# Patient Record
Sex: Female | Born: 1990
Health system: Southern US, Community
[De-identification: ages and names within clinical notes are randomized; demographics above are authoritative.]

## PROBLEM LIST (undated history)

## (undated) DIAGNOSIS — F329 Major depressive disorder, single episode, unspecified: Secondary | ICD-10-CM

## (undated) DIAGNOSIS — F32A Depression, unspecified: Secondary | ICD-10-CM

## (undated) DIAGNOSIS — F419 Anxiety disorder, unspecified: Secondary | ICD-10-CM

## (undated) DIAGNOSIS — N390 Urinary tract infection, site not specified: Secondary | ICD-10-CM

## (undated) DIAGNOSIS — G43909 Migraine, unspecified, not intractable, without status migrainosus: Secondary | ICD-10-CM

## (undated) DIAGNOSIS — A749 Chlamydial infection, unspecified: Secondary | ICD-10-CM

## (undated) HISTORY — DX: Migraine, unspecified, not intractable, without status migrainosus: G43.909

## (undated) HISTORY — DX: Depression, unspecified: F32.A

## (undated) HISTORY — PX: WISDOM TOOTH EXTRACTION: SHX21

## (undated) HISTORY — DX: Anxiety disorder, unspecified: F41.9

## (undated) HISTORY — DX: Chlamydial infection, unspecified: A74.9

## (undated) HISTORY — DX: Urinary tract infection, site not specified: N39.0

## (undated) HISTORY — PX: TONSILLECTOMY: SUR1361

## (undated) HISTORY — DX: Major depressive disorder, single episode, unspecified: F32.9

## (undated) HISTORY — PX: ADENOIDECTOMY: SUR15

---

## 2012-02-23 ENCOUNTER — Observation Stay: Payer: Self-pay | Admitting: Obstetrics and Gynecology

## 2012-02-23 LAB — PIH PROFILE
Anion Gap: 9 (ref 7–16)
Calcium, Total: 8.7 mg/dL (ref 8.5–10.1)
Co2: 25 mmol/L (ref 21–32)
Creatinine: 0.7 mg/dL (ref 0.60–1.30)
EGFR (African American): >60
EGFR (Non-African Amer.): >60
Glucose: 81 mg/dL (ref 65–99)
HCT: 33.4 % — ABNORMAL LOW (ref 35.0–47.0)
HGB: 11.4 g/dL — ABNORMAL LOW (ref 12.0–16.0)
MCHC: 34 g/dL (ref 32.0–36.0)
Osmolality: 272 (ref 275–301)
Platelet: 306 10*3/uL (ref 150–440)
RBC: 3.7 10*6/uL — ABNORMAL LOW (ref 3.80–5.20)
SGOT(AST): 11 U/L — ABNORMAL LOW (ref 15–37)
Sodium: 138 mmol/L (ref 136–145)
Uric Acid: 4.5 mg/dL (ref 2.6–6.0)
WBC: 15.1 10*3/uL — ABNORMAL HIGH (ref 3.6–11.0)

## 2012-02-23 LAB — PROTEIN / CREATININE RATIO, URINE: Protein/Creat. Ratio: 153 mg/gCREAT (ref 0–200)

## 2012-02-29 ENCOUNTER — Inpatient Hospital Stay: Payer: Self-pay | Admitting: Obstetrics & Gynecology

## 2012-02-29 LAB — CBC WITH DIFFERENTIAL/PLATELET
Eosinophil #: 0.1 10*3/uL (ref 0.0–0.7)
HCT: 35.9 % (ref 35.0–47.0)
HGB: 12.3 g/dL (ref 12.0–16.0)
Lymphocyte #: 1.9 10*3/uL (ref 1.0–3.6)
MCH: 30.7 pg (ref 26.0–34.0)
MCHC: 34.2 g/dL (ref 32.0–36.0)
Monocyte #: 0.7 x10 3/mm (ref 0.2–0.9)
Neutrophil %: 82.1 %
Platelet: 340 10*3/uL (ref 150–440)
WBC: 15.6 10*3/uL — ABNORMAL HIGH (ref 3.6–11.0)

## 2015-08-16 LAB — HM PAP SMEAR

## 2016-10-19 ENCOUNTER — Emergency Department
Admission: EM | Admit: 2016-10-19 | Discharge: 2016-10-19 | Disposition: A | Discharge status: Home / Self Care | Payer: Self-pay | Attending: Emergency Medicine | Admitting: Emergency Medicine

## 2016-10-19 ENCOUNTER — Encounter: Payer: Self-pay | Admitting: Emergency Medicine

## 2016-10-19 ENCOUNTER — Emergency Department: Payer: Self-pay

## 2016-10-19 DIAGNOSIS — N83291 Other ovarian cyst, right side: Secondary | ICD-10-CM | POA: Insufficient documentation

## 2016-10-19 DIAGNOSIS — R102 Pelvic and perineal pain: Secondary | ICD-10-CM | POA: Insufficient documentation

## 2016-10-19 DIAGNOSIS — N342 Other urethritis: Secondary | ICD-10-CM

## 2016-10-19 DIAGNOSIS — N341 Nonspecific urethritis: Secondary | ICD-10-CM | POA: Insufficient documentation

## 2016-10-19 DIAGNOSIS — N83201 Unspecified ovarian cyst, right side: Secondary | ICD-10-CM

## 2016-10-19 DIAGNOSIS — A749 Chlamydial infection, unspecified: Secondary | ICD-10-CM | POA: Insufficient documentation

## 2016-10-19 DIAGNOSIS — F17200 Nicotine dependence, unspecified, uncomplicated: Secondary | ICD-10-CM | POA: Insufficient documentation

## 2016-10-19 LAB — URINALYSIS, COMPLETE (UACMP) WITH MICROSCOPIC
BILIRUBIN URINE: NEGATIVE
Bacteria, UA: NONE SEEN
GLUCOSE, UA: NEGATIVE mg/dL
Ketones, ur: NEGATIVE mg/dL
NITRITE: NEGATIVE
PROTEIN: 30 mg/dL — AB
Specific Gravity, Urine: 1.012 (ref 1.005–1.030)
pH: 6 (ref 5.0–8.0)

## 2016-10-19 LAB — COMPREHENSIVE METABOLIC PANEL
ALBUMIN: 4.2 g/dL (ref 3.5–5.0)
ALT: 18 U/L (ref 14–54)
ANION GAP: 8 (ref 5–15)
AST: 22 U/L (ref 15–41)
Alkaline Phosphatase: 58 U/L (ref 38–126)
BILIRUBIN TOTAL: 0.6 mg/dL (ref 0.3–1.2)
BUN: 11 mg/dL (ref 6–20)
CHLORIDE: 108 mmol/L (ref 101–111)
CO2: 24 mmol/L (ref 22–32)
Calcium: 9.1 mg/dL (ref 8.9–10.3)
Creatinine, Ser: 0.85 mg/dL (ref 0.44–1.00)
GFR calc Af Amer: >60 mL/min (ref >60)
GFR calc non Af Amer: >60 mL/min (ref >60)
GLUCOSE: 94 mg/dL (ref 65–99)
POTASSIUM: 4 mmol/L (ref 3.5–5.1)
Sodium: 140 mmol/L (ref 135–145)
TOTAL PROTEIN: 7.5 g/dL (ref 6.5–8.1)

## 2016-10-19 LAB — CBC
HCT: 41.9 % (ref 35.0–47.0)
HEMOGLOBIN: 14.2 g/dL (ref 12.0–16.0)
MCH: 30.4 pg (ref 26.0–34.0)
MCHC: 33.9 g/dL (ref 32.0–36.0)
MCV: 89.6 fL (ref 80.0–100.0)
PLATELETS: 406 10*3/uL (ref 150–440)
RBC: 4.68 MIL/uL (ref 3.80–5.20)
RDW: 13.2 % (ref 11.5–14.5)
WBC: 9.2 10*3/uL (ref 3.6–11.0)

## 2016-10-19 LAB — CHLAMYDIA/NGC RT PCR (ARMC ONLY)
CHLAMYDIA TR: DETECTED — AB
N GONORRHOEAE: NOT DETECTED

## 2016-10-19 LAB — WET PREP, GENITAL
Clue Cells Wet Prep HPF POC: NONE SEEN
Sperm: NONE SEEN
Trich, Wet Prep: NONE SEEN
YEAST WET PREP: NONE SEEN

## 2016-10-19 LAB — POCT PREGNANCY, URINE: PREG TEST UR: NEGATIVE

## 2016-10-19 LAB — LIPASE, BLOOD: Lipase: 23 U/L (ref 11–51)

## 2016-10-19 MED ORDER — HYDROCODONE-ACETAMINOPHEN 5-325 MG PO TABS: 1.0000 | ORAL_TABLET | Freq: Four times a day (QID) | ORAL | 0 refills | Status: DC | PRN

## 2016-10-19 MED ORDER — MORPHINE SULFATE (PF) 4 MG/ML IV SOLN
4.0000 mg | Freq: Once | INTRAVENOUS | Status: AC
  Administered 2016-10-19: 4 mg via INTRAVENOUS
  Filled 2016-10-19: qty 1

## 2016-10-19 MED ORDER — DEXTROSE 5 % IV SOLN
1.0000 g | Freq: Once | INTRAVENOUS | Status: AC
  Administered 2016-10-19: 1 g via INTRAVENOUS
  Filled 2016-10-19: qty 10

## 2016-10-19 MED ORDER — ONDANSETRON HCL 4 MG/2ML IJ SOLN
4.0000 mg | Freq: Once | INTRAMUSCULAR | Status: AC
  Administered 2016-10-19: 4 mg via INTRAVENOUS
  Filled 2016-10-19: qty 2

## 2016-10-19 MED ORDER — IBUPROFEN 800 MG PO TABS: 800.0000 mg | ORAL_TABLET | Freq: Three times a day (TID) | ORAL | 0 refills | Status: DC | PRN

## 2016-10-19 MED ORDER — AZITHROMYCIN 1 G PO PACK
1.0000 g | PACK | Freq: Once | ORAL | Status: AC
  Administered 2016-10-19: 1 g via ORAL
  Filled 2016-10-19: qty 1

## 2016-10-19 MED ORDER — DOXYCYCLINE HYCLATE 100 MG PO CAPS
100.0000 mg | ORAL_CAPSULE | Freq: Two times a day (BID) | ORAL | 0 refills | Status: DC
Start: 2016-10-19 — End: 2017-05-16

## 2016-10-19 MED ORDER — SODIUM CHLORIDE 0.9 % IV BOLUS (SEPSIS)
1000.0000 mL | Freq: Once | INTRAVENOUS | Status: AC
  Administered 2016-10-19: 1000 mL via INTRAVENOUS

## 2016-10-19 NOTE — Discharge Instructions (Signed)
Take motrin for pain.   Take vicodin for severe pain. Do NOT drive with it.   You have a cyst in your ovary that will likely resolve on its own. See GYN doctor for repeat ultrasound.   You have urinary tract infection and chlamydia. Take doxycyline as prescribed. Your partner needs to be notified   Return to ER if you have worse abdominal pain, fever, vomiting, worse pelvic discharge.

## 2016-10-19 NOTE — ED Provider Notes (Signed)
[facility]-EMERGENCY DEPARTMENT Provider Note   CSN: [id number] Arrival date & time: 10/19/16  1015     History   Chief Complaint Chief Complaint  Patient presents with  . Abdominal Pain    HPI Whitney Rojas is a 26 y.o. female otherwise healthy here with RLQ pain. RLQ pain for the last 2 days. It is worse in the morning and a crampy sensation that lasts for hours. She felt nauseated but had no vomiting or fevers. Has vaginal discharge but no bleeding. LMP was 2 weeks ago. She has no urinary symptoms. Patient had no previous abdominal surgeries.   The history is provided by the patient.    History reviewed. No pertinent past medical history.  There are no active problems to display for this patient.   Past Surgical History:  Procedure Laterality Date  . ADENOIDECTOMY    . TONSILLECTOMY      OB History    No data available       Home Medications    Prior to Admission medications   Not on File    Family History History reviewed. No pertinent family history.  Social History Social History  Substance Use Topics  . Smoking status: Current Every Day Smoker  . Smokeless tobacco: Never Used  . Alcohol use Yes     Allergies   Patient has no known allergies.   Review of Systems Review of Systems  Gastrointestinal: Positive for abdominal pain and nausea.  All other systems reviewed and are negative.    Physical Exam Updated Vital Signs BP 120/89   Pulse 63   Temp 98 F (36.7 C) (Oral)   Resp 13   Ht 5\' 5"  (1.651 m)   Wt 90.7 kg (200 lb)   LMP 10/03/2016   SpO2 95%   BMI 33.28 kg/m   Physical Exam  Constitutional: She is oriented to person, place, and time.  Uncomfortable, tearful   HENT:  Head: Normocephalic.  Mouth/Throat: Oropharynx is clear and moist.  Eyes: Conjunctivae and EOM are normal. Pupils are equal, round, and reactive to light.  Neck: Normal range of motion. Neck supple.  Cardiovascular: Normal rate, regular rhythm and normal  heart sounds.   Pulmonary/Chest: Effort normal and breath sounds normal. No respiratory distress. She has no wheezes.  Abdominal: Soft. Bowel sounds are normal.  + RLQ tenderness, no rebound   Genitourinary:  Genitourinary Comments: Mild R adnexal tenderness, + pelvic discharge. No CMT. Mild uterine tenderness   Musculoskeletal: Normal range of motion.  Neurological: She is alert and oriented to person, place, and time.  Skin: Skin is warm.  Psychiatric: She has a normal mood and affect.  Nursing note and vitals reviewed.    ED Treatments / Results  Labs (all labs ordered are listed, but only abnormal results are displayed) Labs Reviewed  WET PREP, GENITAL - Abnormal; Notable for the following:       Result Value   WBC, Wet Prep HPF POC MANY (*)    All other components within normal limits  CHLAMYDIA/NGC RT PCR ([facility] ONLY) - Abnormal; Notable for the following:    Chlamydia Tr DETECTED (*)    All other components within normal limits  URINALYSIS, COMPLETE (UACMP) WITH MICROSCOPIC - Abnormal; Notable for the following:    Color, Urine YELLOW (*)    APPearance HAZY (*)    Hgb urine dipstick MODERATE (*)    Protein, ur 30 (*)    Leukocytes, UA LARGE (*)    Squamous Epithelial /  LPF 0-5 (*)    All other components within normal limits  LIPASE, BLOOD  COMPREHENSIVE METABOLIC PANEL  CBC  POC URINE PREG, ED  POCT PREGNANCY, URINE    EKG  EKG Interpretation None       Radiology US Transvaginal Non-ob  Result Date: 10/19/2016 CLINICAL DATA:  Pelvic pain for 1 day. EXAM: TRANSABDOMINAL AND TRANSVAGINAL ULTRASOUND OF PELVIS DOPPLER ULTRASOUND OF OVARIES TECHNIQUE: Both transabdominal and transvaginal ultrasound examinations of the pelvis were performed. Transabdominal technique was performed for global imaging of the pelvis including uterus, ovaries, adnexal regions, and pelvic cul-de-sac. It was necessary to proceed with endovaginal exam following the transabdominal  exam to visualize the ovaries and endometrium. Color and duplex Doppler ultrasound was utilized to evaluate blood flow to the ovaries. COMPARISON:  None. FINDINGS: Uterus Measurements: 9.9 x 4.6 x 7.3 cm. No fibroids or other mass visualized. Endometrium Thickness: 7.5 mm.  No focal abnormality visualized. Right ovary Measurements: 5 x 3.5 x 4.5 cm. A complex hypoechoic region in the right ovary is likely a hemorrhagic follicle measuring 3.9 x 3.2 x 3.7 cm. Left ovary Measurements: 3.6 x 2.2 x 2.0 cm. Normal appearance/no adnexal mass. Pulsed Doppler evaluation of both ovaries demonstrates normal low-resistance arterial and venous waveforms. Other findings Trace fluid in the cul-de-sac and adjacent to the right ovary is likely physiologic. The IUD is in good position, located within the fundus. IMPRESSION: 1. A mass in the right ovary is likely a hemorrhagic cyst measuring up to 3.9 cm. Recommend a follow-up ultrasound in 6-12 weeks to ensure resolution. 2. No other significant abnormalities. Electronically Signed   By: Gerome Sam III M.D   On: 10/19/2016 14:46   US Pelvis Complete  Result Date: 10/19/2016 CLINICAL DATA:  Pelvic pain for 1 day. EXAM: TRANSABDOMINAL AND TRANSVAGINAL ULTRASOUND OF PELVIS DOPPLER ULTRASOUND OF OVARIES TECHNIQUE: Both transabdominal and transvaginal ultrasound examinations of the pelvis were performed. Transabdominal technique was performed for global imaging of the pelvis including uterus, ovaries, adnexal regions, and pelvic cul-de-sac. It was necessary to proceed with endovaginal exam following the transabdominal exam to visualize the ovaries and endometrium. Color and duplex Doppler ultrasound was utilized to evaluate blood flow to the ovaries. COMPARISON:  None. FINDINGS: Uterus Measurements: 9.9 x 4.6 x 7.3 cm. No fibroids or other mass visualized. Endometrium Thickness: 7.5 mm.  No focal abnormality visualized. Right ovary Measurements: 5 x 3.5 x 4.5 cm. A complex  hypoechoic region in the right ovary is likely a hemorrhagic follicle measuring 3.9 x 3.2 x 3.7 cm. Left ovary Measurements: 3.6 x 2.2 x 2.0 cm. Normal appearance/no adnexal mass. Pulsed Doppler evaluation of both ovaries demonstrates normal low-resistance arterial and venous waveforms. Other findings Trace fluid in the cul-de-sac and adjacent to the right ovary is likely physiologic. The IUD is in good position, located within the fundus. IMPRESSION: 1. A mass in the right ovary is likely a hemorrhagic cyst measuring up to 3.9 cm. Recommend a follow-up ultrasound in 6-12 weeks to ensure resolution. 2. No other significant abnormalities. Electronically Signed   By: Gerome Sam III M.D   On: 10/19/2016 14:46   [place] Art/ven Flow Abd Pelv Doppler  Result Date: 10/19/2016 CLINICAL DATA:  Pelvic pain for 1 day. EXAM: TRANSABDOMINAL AND TRANSVAGINAL ULTRASOUND OF PELVIS DOPPLER ULTRASOUND OF OVARIES TECHNIQUE: Both transabdominal and transvaginal ultrasound examinations of the pelvis were performed. Transabdominal technique was performed for global imaging of the pelvis including uterus, ovaries, adnexal regions, and pelvic cul-de-sac. It was  necessary to proceed with endovaginal exam following the transabdominal exam to visualize the ovaries and endometrium. Color and duplex Doppler ultrasound was utilized to evaluate blood flow to the ovaries. COMPARISON:  None. FINDINGS: Uterus Measurements: 9.9 x 4.6 x 7.3 cm. No fibroids or other mass visualized. Endometrium Thickness: 7.5 mm.  No focal abnormality visualized. Right ovary Measurements: 5 x 3.5 x 4.5 cm. A complex hypoechoic region in the right ovary is likely a hemorrhagic follicle measuring 3.9 x 3.2 x 3.7 cm. Left ovary Measurements: 3.6 x 2.2 x 2.0 cm. Normal appearance/no adnexal mass. Pulsed Doppler evaluation of both ovaries demonstrates normal low-resistance arterial and venous waveforms. Other findings Trace fluid in the cul-de-sac and adjacent to the  right ovary is likely physiologic. The IUD is in good position, located within the fundus. IMPRESSION: 1. A mass in the right ovary is likely a hemorrhagic cyst measuring up to 3.9 cm. Recommend a follow-up ultrasound in 6-12 weeks to ensure resolution. 2. No other significant abnormalities. Electronically Signed   By: Gerome Sam III M.D   On: 10/19/2016 14:46    Procedures Procedures (including critical care time)  Medications Ordered in ED Medications  sodium chloride 0.9 % bolus 1,000 mL (0 mLs Intravenous Stopped 10/19/16 1329)  ondansetron (ZOFRAN) injection 4 mg (4 mg Intravenous Given 10/19/16 1125)  morphine 4 MG/ML injection 4 mg (4 mg Intravenous Given 10/19/16 1126)  cefTRIAXone (ROCEPHIN) 1 g in dextrose 5 % 50 mL IVPB (1 g Intravenous New Bag/Given 10/19/16 1429)  azithromycin (ZITHROMAX) powder 1 g (1 g Oral Given 10/19/16 1432)  morphine 4 MG/ML injection 4 mg (4 mg Intravenous Given 10/19/16 1444)     Initial Impression / Assessment and Plan / ED Course  I have reviewed the triage vital signs and the nursing notes.  Pertinent labs & imaging results that were available during my care of the patient were reviewed by me and considered in my medical decision making (see chart for details).    Whitney Rojas is a 26 y.o. female here with RLQ pain for 2 days. Pelvic exam showed mild R adnexal tenderness. Concerned for possible torsion vs PID vs UTI. Less likely appy. Will get labs, UA, US transvag.   3:30 PM UA + too many to count WBC. Given rocephin in the ED. + chlamydia, given zithromax. US showed R hemorrhagic cyst and no tubo ovarian abscess or torsion. Pain controlled in the ED. Will dc home with doxycycline which will cover UTI and chlamydia. Will dc home with motrin, vicodin as well. Her partner needs to be notified. Recommend outpatient follow up with GYN for repeat US.   Final Clinical Impressions(s) / ED Diagnoses   Final diagnoses:  Pelvic pain  Pelvic pain     New Prescriptions New Prescriptions   No medications on file     Charlynne Pander, MD 10/19/16 [phone]

## 2016-10-19 NOTE — ED Triage Notes (Addendum)
Pt c/o RLQ pain, can pinpoint pain. nauseated from pain. Pain now radiating to left side. Pt tearful in triage, appears in pain, unable to get comfortable. Has not had appendix removed.  Pain worse with palpation of RLQ. Chills last night.

## 2016-10-21 LAB — URINE CULTURE

## 2016-11-03 ENCOUNTER — Ambulatory Visit: Payer: Self-pay | Admitting: Obstetrics and Gynecology

## 2016-11-09 ENCOUNTER — Ambulatory Visit: Payer: Self-pay | Admitting: Certified Nurse Midwife

## 2017-05-16 ENCOUNTER — Encounter: Payer: Self-pay | Admitting: Obstetrics and Gynecology

## 2017-05-16 ENCOUNTER — Ambulatory Visit (INDEPENDENT_AMBULATORY_CARE_PROVIDER_SITE_OTHER): Payer: 59

## 2017-05-16 ENCOUNTER — Ambulatory Visit (INDEPENDENT_AMBULATORY_CARE_PROVIDER_SITE_OTHER): Payer: 59 | Admitting: Obstetrics and Gynecology

## 2017-05-16 VITALS — BP 132/80 | HR 84 | Ht 65.0 in | Wt 219.0 lb

## 2017-05-16 DIAGNOSIS — A749 Chlamydial infection, unspecified: Secondary | ICD-10-CM | POA: Diagnosis not present

## 2017-05-16 DIAGNOSIS — B9689 Other specified bacterial agents as the cause of diseases classified elsewhere: Secondary | ICD-10-CM | POA: Diagnosis not present

## 2017-05-16 DIAGNOSIS — Z30431 Encounter for routine checking of intrauterine contraceptive device: Secondary | ICD-10-CM | POA: Diagnosis not present

## 2017-05-16 DIAGNOSIS — N83201 Unspecified ovarian cyst, right side: Secondary | ICD-10-CM

## 2017-05-16 DIAGNOSIS — N941 Unspecified dyspareunia: Secondary | ICD-10-CM

## 2017-05-16 DIAGNOSIS — Z113 Encounter for screening for infections with a predominantly sexual mode of transmission: Secondary | ICD-10-CM | POA: Diagnosis not present

## 2017-05-16 DIAGNOSIS — N3001 Acute cystitis with hematuria: Secondary | ICD-10-CM

## 2017-05-16 DIAGNOSIS — N760 Acute vaginitis: Secondary | ICD-10-CM

## 2017-05-16 LAB — POCT WET PREP WITH KOH
CLUE CELLS WET PREP PER HPF POC: POSITIVE
KOH Prep POC: POSITIVE — AB
Trichomonas, UA: NEGATIVE
Yeast Wet Prep HPF POC: NEGATIVE

## 2017-05-16 LAB — POCT URINALYSIS DIPSTICK
Bilirubin, UA: NEGATIVE
Glucose, UA: NEGATIVE
Ketones, UA: NEGATIVE
Nitrite, UA: NEGATIVE
PH UA: 7.5 (ref 5.0–8.0)
Spec Grav, UA: 1.015 (ref 1.010–1.025)
Urobilinogen, UA: NEGATIVE E.U./dL — AB

## 2017-05-16 LAB — POCT URINE PREGNANCY: PREG TEST UR: NEGATIVE

## 2017-05-16 MED ORDER — NITROFURANTOIN MONOHYD MACRO 100 MG PO CAPS: 100.0000 mg | ORAL_CAPSULE | Freq: Two times a day (BID) | ORAL | 0 refills | Status: AC

## 2017-05-16 MED ORDER — METRONIDAZOLE 500 MG PO TABS: ORAL_TABLET | ORAL | 0 refills | Status: DC

## 2017-05-16 NOTE — Patient Instructions (Signed)
I value your feedback and entrusting us with your care. If you get a [facility] patient survey, I would appreciate you taking the time to let us know about your experience today. Thank you! 

## 2017-05-16 NOTE — Progress Notes (Addendum)
Chief Complaint  Patient presents with  . Urinary Tract Infection    Pain/pressure when urinates  . Painful intercourse    HPI:      Whitney Rojas is a 27 y.o. G1P1001 who LMP was No LMP recorded. Patient is not currently having periods (Reason: IUD)., presents today for multiple issues. She noted suprapubic pressure, frequency, urgency, dysuria yesterday but sx resolved today. No LBP, fevers. Had UTI 10/18 and treated with macrobid with sx relief (susceptible on C&S).   Pt also has increased vag d/c (milky or green) with odor, irritation. Hx of BV in the past. Also diagnosed with chlamydia 6/18 and treated with doxy for 7 days. Partner treated too but didn't wait full 7 days before sex.  Never had TOC.   Pt also diagnosed with RTO cyst 6/18 in ED and was told to have u/s f/u, but never did. Pt has RLQ dyspareunia.   Pt has Mirena IUD, placed 11/13. Not using condoms even though expired. No menses with IUD.   Past due for annual.   Past Medical History:  Diagnosis Date  . Anxiety   . Depression   . Migraine   . UTI (urinary tract infection)     Past Surgical History:  Procedure Laterality Date  . ADENOIDECTOMY    . TONSILLECTOMY      Family History  Problem Relation Age of Onset  . Hypertension Mother   . Heart disease Father   . Hypertension Father   . Heart disease Brother   . Hypertension Brother   . Melanoma Brother 37  . Sleep apnea Brother   . Cancer Maternal Grandmother   . Hypertension Maternal Grandfather   . Breast cancer Neg Hx   . Ovarian cancer Neg Hx     Social History   Socioeconomic History  . Marital status: Single    Spouse name: Not on file  . Number of children: Not on file  . Years of education: Not on file  . Highest education level: Not on file  Social Needs  . Financial resource strain: Not on file  . Food insecurity - worry: Not on file  . Food insecurity - inability: Not on file  . Transportation needs - medical: Not on file   . Transportation needs - non-medical: Not on file  Occupational History  . Not on file  Tobacco Use  . Smoking status: Current Every Day Smoker  . Smokeless tobacco: Never Used  Substance and Sexual Activity  . Alcohol use: Yes  . Drug use: No  . Sexual activity: Yes    Birth control/protection: IUD  Other Topics Concern  . Not on file  Social History Narrative  . Not on file     Current Outpatient Medications:  .  levonorgestrel (MIRENA) 20 MCG/24HR IUD, 1 each by Intrauterine route once., Disp: , Rfl:  .  metroNIDAZOLE (FLAGYL) 500 MG tablet, Take 2 tabs BID for 1 day, Disp: 4 tablet, Rfl: 0 .  nitrofurantoin, macrocrystal-monohydrate, (MACROBID) 100 MG capsule, Take 1 capsule (100 mg total) by mouth 2 (two) times daily for 7 days., Disp: 14 capsule, Rfl: 0   ROS:  Review of Systems  Constitutional: Negative for fatigue, fever and unexpected weight change.  Respiratory: Negative for cough, shortness of breath and wheezing.   Cardiovascular: Negative for chest pain, palpitations and leg swelling.  Gastrointestinal: Negative for blood in stool, constipation, diarrhea, nausea and vomiting.  Endocrine: Negative for cold intolerance, heat intolerance and polyuria.  Genitourinary: Positive for dyspareunia, dysuria, frequency, pelvic pain, urgency and vaginal discharge. Negative for flank pain, genital sores, hematuria, menstrual problem, vaginal bleeding and vaginal pain.  Musculoskeletal: Negative for back pain, joint swelling and myalgias.  Skin: Negative for rash.  Neurological: Negative for dizziness, syncope, light-headedness, numbness and headaches.  Hematological: Negative for adenopathy.  Psychiatric/Behavioral: Negative for agitation, confusion, sleep disturbance and suicidal ideas. The patient is not nervous/anxious.      OBJECTIVE:   Vitals:  BP 132/80 (BP Location: Left Arm, Patient Position: Sitting, Cuff Size: Large)   Pulse 84   Ht 5\' 5"  (1.651 m)   Wt 219  lb (99.3 kg)   BMI 36.44 kg/m   Physical Exam  Constitutional: She is oriented to person, place, and time and well-developed, well-nourished, and in no distress. Vital signs are normal.  Abdominal: There is no CVA tenderness.  Genitourinary: Vagina normal, uterus normal, cervix normal, right adnexa normal, left adnexa normal and vulva normal. Uterus is not enlarged. Cervix exhibits no motion tenderness and no tenderness. Right adnexum displays no mass and no tenderness. Left adnexum displays no mass and no tenderness. Vulva exhibits no erythema, no exudate, no lesion, no rash and no tenderness. Vagina exhibits no lesion.  Genitourinary Comments: IUD STRINGS NOT VISIBLE BUT IUD IN PLACE ON 6/18 U/S  Neurological: She is alert and oriented to person, place, and time.  Psychiatric: Affect and judgment normal.  Vitals reviewed.   Results: Results for orders placed or performed in visit on 05/16/17 (from the past 24 hour(s))  POCT urinalysis dipstick     Status: Abnormal   Collection Time: 05/16/17  1:46 PM  Result Value Ref Range   Color, UA Orange    Clarity, UA Clear    Glucose, UA Neg    Bilirubin, UA Neg    Ketones, UA neg    Spec Grav, UA 1.015 1.010 - 1.025   Blood, UA Moderate    pH, UA 7.5 5.0 - 8.0   Protein, UA Small    Urobilinogen, UA negative (A) 0.2 or 1.0 E.U./dL   Nitrite, UA Neg    Leukocytes, UA Large (3+) (A) Negative   Appearance     Odor    POCT urine pregnancy     Status: Normal   Collection Time: 05/16/17  2:28 PM  Result Value Ref Range   Preg Test, Ur Negative Negative  POCT Wet Prep with KOH     Status: Abnormal   Collection Time: 05/16/17  4:54 PM  Result Value Ref Range   Trichomonas, UA Negative    Clue Cells Wet Prep HPF POC pos    Epithelial Wet Prep HPF POC  Few, Moderate, Many, Too numerous to count   Yeast Wet Prep HPF POC neg    Bacteria Wet Prep HPF POC  Few   RBC Wet Prep HPF POC     WBC Wet Prep HPF POC     KOH Prep POC Positive (A)  Negative   GYN U/S--> ULTRASOUND REPORT  Location: [facility]  Date of Service: 05/16/2017    Indications:f/u right ovarian cyst Findings:  The uterus measures 9.32 x 6.32 x 4.69cm. Echo texture is homogenous without evidence of focal masses.  The Endometrium measures 3.12 mm. - IUD appears to be in the correct location in the fundus of the uterus.  Right Ovary measures 4.28 x 3.40 x 2.26 cm. It is normal in appearance with a dominant follicle. Left Ovary measures 3.72 x 2.89 x  2.05 cm. It is normal appearance. Survey of the adnexa demonstrates no adnexal masses. There is no free fluid in the cul de sac.  Impression: 1. Right ovarian cyst appears to have resolved. 2. IUD is in the correct location.  Recommendations: 1.Clinical correlation with the patient's History and Physical Exam.   Willette Alma, RDMS, RVT  Review of ULTRASOUND.    I have personally reviewed images and report of recent ultrasound done at [facility].    Plan of management to be discussed with patient.  Annamarie Major, MD, [facility], [facility] 05/17/2017  7:46 AM   Assessment/Plan: Bacterial vaginosis - Pos wet prep. Rx flagyl. F/u prn. No EtoH. - Plan: POCT Wet Prep with KOH, metroNIDAZOLE (FLAGYL) 500 MG tablet  Acute cystitis with hematuria - Rx macrobid. Check C&S. Will f/u with resulst.  - Plan: POCT urinalysis dipstick, Urine Culture, nitrofurantoin, macrocrystal-monohydrate, (MACROBID) 100 MG capsule, POCT urine pregnancy  Right ovarian cyst - Resolved per GYN u/s today. Reassurance. F/u prn. - Plan: US PELVIS TRANSVANGINAL NON-OB (TV ONLY)  Chlamydia - TOC today. Will f/u with results. - Plan: Chlamydia/Gonococcus/Trichomonas, NAA  Screening for STD (sexually transmitted disease) - Plan: Chlamydia/Gonococcus/Trichomonas, NAA  Dyspareunia in female - RTO cyst resolved. STD testing. Will f/u with results.  Encounter for routine checking of  intrauterine contraceptive device (IUD) - IUD due for rem 11/18 but checking to see if Mirena has 7 yr indication yet. Will f/u with pt. Neg UPT today    Meds ordered this encounter  Medications  . nitrofurantoin, macrocrystal-monohydrate, (MACROBID) 100 MG capsule    Sig: Take 1 capsule (100 mg total) by mouth 2 (two) times daily for 7 days.    Dispense:  14 capsule    Refill:  0    Order Specific Question:   Supervising Provider    Answer:   Nadara Mustard [id number]  . metroNIDAZOLE (FLAGYL) 500 MG tablet    Sig: Take 2 tabs BID for 1 day    Dispense:  4 tablet    Refill:  0    Order Specific Question:   Supervising Provider    Answer:   Nadara Mustard [[id number]]      Return if symptoms worsen or fail to improve./annual time frame based on nuswab results.   Whitney B. Copland, PA-C 05/16/2017 4:56 PM

## 2017-05-17 NOTE — Telephone Encounter (Signed)
This encounter was created in error - please disregard.

## 2017-05-18 LAB — CHLAMYDIA/GONOCOCCUS/TRICHOMONAS, NAA
CHLAMYDIA BY NAA: POSITIVE — AB
GONOCOCCUS BY NAA: NEGATIVE
Trich vag by NAA: NEGATIVE

## 2017-05-18 LAB — URINE CULTURE

## 2017-05-19 ENCOUNTER — Telehealth: Payer: Self-pay | Admitting: Obstetrics and Gynecology

## 2017-05-19 DIAGNOSIS — A749 Chlamydial infection, unspecified: Secondary | ICD-10-CM

## 2017-05-19 MED ORDER — DOXYCYCLINE HYCLATE 100 MG PO CAPS: 100.0000 mg | ORAL_CAPSULE | Freq: Two times a day (BID) | ORAL | 0 refills | Status: DC

## 2017-05-19 NOTE — Telephone Encounter (Signed)
LM with chlamydia results. Rx doxy. Parnter needs tx. Condoms for 1 wk. RTO for TOC at annual in a few wks. RN to notify [facility].   Pt also on macrobid for UTI. F/u with us if sx persist for change to amox Rx.

## 2017-05-21 NOTE — Telephone Encounter (Signed)
done

## 2017-05-22 ENCOUNTER — Telehealth: Payer: Self-pay | Admitting: Obstetrics and Gynecology

## 2017-05-22 MED ORDER — AMOXICILLIN 500 MG PO CAPS: 500.0000 mg | ORAL_CAPSULE | Freq: Two times a day (BID) | ORAL | 0 refills | Status: AC

## 2017-05-22 MED ORDER — FLUCONAZOLE 150 MG PO TABS: 150.0000 mg | ORAL_TABLET | Freq: Once | ORAL | 0 refills | Status: AC

## 2017-05-22 NOTE — Addendum Note (Signed)
Addended by: Althea GrimmerOPLAND, Ginny Loomer B on: 05/22/2017 10:36 AM   Modules accepted: Orders

## 2017-05-22 NOTE — Telephone Encounter (Signed)
Spoke with pt. Still having UTI sx. Was put on macrobid but culture showed GBS. Will change abx to amox BID for 7 days. Rx diflucan prn yeast vag. D/C macrobid.   Pt still hasn't started doxy for chlamydia due to cost but will start this wk. RTO in 4 wks after tx for annual.  Pt would also like replacement IUD. Will do that at appt after annual and chlamydia TOC.

## 2017-05-22 NOTE — Telephone Encounter (Signed)
LM for pt confirming she got my msg about chlamydia and tx.  Pt due for annual in 4 wks for TOC, too. Pt needs to sched.  Also, confirmed 5 yr indication for Mirena, so pt needs to use condoms in meantime due to Mirena expiration 11/18. Can replace IUD but can't do annual, IUD removal and replacement at same visit per insurance guidelines.  Asked pt to call me to f/u.

## 2017-07-18 ENCOUNTER — Ambulatory Visit: Payer: 59 | Admitting: Obstetrics and Gynecology

## 2017-07-26 ENCOUNTER — Ambulatory Visit (INDEPENDENT_AMBULATORY_CARE_PROVIDER_SITE_OTHER): Payer: 59 | Admitting: Obstetrics and Gynecology

## 2017-07-26 ENCOUNTER — Encounter: Payer: Self-pay | Admitting: Obstetrics and Gynecology

## 2017-07-26 ENCOUNTER — Telehealth: Payer: Self-pay | Admitting: Obstetrics and Gynecology

## 2017-07-26 VITALS — BP 130/90 | HR 87 | Ht 65.0 in | Wt 219.0 lb

## 2017-07-26 DIAGNOSIS — Z01419 Encounter for gynecological examination (general) (routine) without abnormal findings: Secondary | ICD-10-CM | POA: Diagnosis not present

## 2017-07-26 DIAGNOSIS — Z30431 Encounter for routine checking of intrauterine contraceptive device: Secondary | ICD-10-CM | POA: Diagnosis not present

## 2017-07-26 DIAGNOSIS — Z124 Encounter for screening for malignant neoplasm of cervix: Secondary | ICD-10-CM | POA: Diagnosis not present

## 2017-07-26 DIAGNOSIS — A749 Chlamydial infection, unspecified: Secondary | ICD-10-CM

## 2017-07-26 DIAGNOSIS — Z113 Encounter for screening for infections with a predominantly sexual mode of transmission: Secondary | ICD-10-CM | POA: Diagnosis not present

## 2017-07-26 MED ORDER — AZITHROMYCIN 250 MG PO TABS: 1000.0000 mg | ORAL_TABLET | Freq: Once | ORAL | 0 refills | Status: AC

## 2017-07-26 MED ORDER — MISOPROSTOL 100 MCG PO TABS: 100.0000 ug | ORAL_TABLET | Freq: Once | ORAL | 0 refills | Status: DC

## 2017-07-26 NOTE — Progress Notes (Signed)
PCP:  [facility]   Chief Complaint  Patient presents with  . Gynecologic Exam     HPI:      Ms. Whitney Rojas is a 28 y.o. G1P1001 who LMP was No LMP recorded (lmp unknown). (Menstrual status: IUD)., presents today for her annual examination.  Her menses are absent with IUD. Occas BTB. Occas  Dysmenorrhea, fleeting sx.   Sex activity: single partner, contraception - IUD. Mirena placed 04/25/12 and is expired. Pt wants a replacement IUD. Strings not visible but in place on 1/19 u/s.  She and partner aren't using condoms, even though suggested this to pt.  Last Pap: August 16, 2015  Results were: no abnormalities Hx of STDs: chlamydia. Wants full STD testing. Pt with chlamydia 05/16/17. Never did doxy tx due to cost. Still has green d/c with odor. Partner was treated but not using condoms.   UTI and BV sx from 1/19 resolved after abx tx.  There is no FH of breast cancer. There is no FH of ovarian cancer. The patient does do self-breast exams.  Tobacco use: The patient currently smokes 1/3 packs of cigarettes per day for the past several years. Alcohol use: social drinker No drug use.  Exercise: moderately active  She does get adequate calcium and Vitamin D in her diet.   Past Medical History:  Diagnosis Date  . Anxiety   . Depression   . Migraine   . UTI (urinary tract infection)     Past Surgical History:  Procedure Laterality Date  . ADENOIDECTOMY    . TONSILLECTOMY      Family History  Problem Relation Age of Onset  . Hypertension Mother   . Heart disease Father   . Hypertension Father   . Heart disease Brother   . Hypertension Brother   . Melanoma Brother 37  . Sleep apnea Brother   . Cancer Maternal Grandmother   . Hypertension Maternal Grandfather   . Breast cancer Neg Hx   . Ovarian cancer Neg Hx     Social History   Socioeconomic History  . Marital status: Single    Spouse name: Not on file  . Number of children: Not on file  .  Years of education: Not on file  . Highest education level: Not on file  Occupational History  . Not on file  Social Needs  . Financial resource strain: Not on file  . Food insecurity:    Worry: Not on file    Inability: Not on file  . Transportation needs:    Medical: Not on file    Non-medical: Not on file  Tobacco Use  . Smoking status: Current Every Day Smoker  . Smokeless tobacco: Never Used  Substance and Sexual Activity  . Alcohol use: Yes  . Drug use: No  . Sexual activity: Yes    Birth control/protection: IUD    Comment: Mirena   Lifestyle  . Physical activity:    Days per week: Not on file    Minutes per session: Not on file  . Stress: Not on file  Relationships  . Social connections:    Talks on phone: Not on file    Gets together: Not on file    Attends religious service: Not on file    Active member of club or organization: Not on file    Attends meetings of clubs or organizations: Not on file    Relationship status: Not on file  . Intimate partner violence:  Fear of current or ex partner: Not on file    Emotionally abused: Not on file    Physically abused: Not on file    Forced sexual activity: Not on file  Other Topics Concern  . Not on file  Social History Narrative  . Not on file    Outpatient Medications Prior to Visit  Medication Sig Dispense Refill  . levonorgestrel (MIRENA) 20 MCG/24HR IUD 1 each by Intrauterine route once.    . metroNIDAZOLE (FLAGYL) 500 MG tablet Take 2 tabs BID for 1 day (Patient not taking: Reported on 07/26/2017) 4 tablet 0  . doxycycline (VIBRAMYCIN) 100 MG capsule Take 1 capsule (100 mg total) by mouth 2 (two) times daily. (Patient not taking: Reported on 07/26/2017) 14 capsule 0   No facility-administered medications prior to visit.       ROS:  Review of Systems  Constitutional: Negative for fatigue, fever and unexpected weight change.  Respiratory: Negative for cough, shortness of breath and wheezing.     Cardiovascular: Negative for chest pain, palpitations and leg swelling.  Gastrointestinal: Negative for blood in stool, constipation, diarrhea, nausea and vomiting.  Endocrine: Negative for cold intolerance, heat intolerance and polyuria.  Genitourinary: Positive for dyspareunia and vaginal discharge. Negative for dysuria, flank pain, frequency, genital sores, hematuria, menstrual problem, pelvic pain, urgency, vaginal bleeding and vaginal pain.  Musculoskeletal: Negative for back pain, joint swelling and myalgias.  Skin: Negative for rash.  Neurological: Negative for dizziness, syncope, light-headedness, numbness and headaches.  Hematological: Negative for adenopathy.  Psychiatric/Behavioral: Negative for agitation, confusion, sleep disturbance and suicidal ideas. The patient is not nervous/anxious.    BREAST: No symptoms   Objective: BP 130/90   Pulse 87   Ht 5\' 5"  (1.651 m)   Wt 219 lb (99.3 kg)   LMP  (LMP Unknown)   BMI 36.44 kg/m    Physical Exam  Constitutional: She is oriented to person, place, and time. She appears well-developed and well-nourished.  Genitourinary: Uterus normal. There is no rash or tenderness on the right labia. There is no rash or tenderness on the left labia. No erythema or tenderness in the vagina. Vaginal discharge found. Right adnexum does not display mass and does not display tenderness. Left adnexum does not display mass and does not display tenderness.  Cervix exhibits friability. Cervix does not exhibit motion tenderness or polyp. Uterus is not enlarged or tender.  Genitourinary Comments: PT WITH CHLAMYDIA D/C AND FRIABLE CX  Neck: Normal range of motion. No thyromegaly present.  Cardiovascular: Normal rate, regular rhythm and normal heart sounds.  No murmur heard. Pulmonary/Chest: Effort normal and breath sounds normal. Right breast exhibits no mass, no nipple discharge, no skin change and no tenderness. Left breast exhibits no mass, no nipple  discharge, no skin change and no tenderness.  Abdominal: Soft. There is no tenderness. There is no guarding.  Musculoskeletal: Normal range of motion.  Neurological: She is alert and oriented to person, place, and time. No cranial nerve deficit.  Psychiatric: She has a normal mood and affect. Her behavior is normal.  Vitals reviewed.   Assessment/Plan: Encounter for annual routine gynecological examination  Cervical cancer screening - Plan: IGP, rfx Aptima HPV ASCU  Encounter for routine checking of intrauterine contraceptive device (IUD) - Lost strings. IUD in place on u/s 1/19. Due for replacement. Treat chlamydia first. RTO in 4 wks for TOC, Mirena replacement. Rx cytotec/NSAIDs.  - Plan: misoprostol (CYTOTEC) 100 MCG tablet  Chlamydia - Not treated. Changed Rx  to azithro--called to pharm. Partner needs tx. RTO in 4 wks for TOC. CONDOMS!! - Plan: azithromycin (ZITHROMAX) 250 MG tablet  Screening for STD (sexually transmitted disease) - Plan: HIV antibody, Hepatitis C antibody, RPR, HSV 2 antibody, IgG  Meds ordered this encounter  Medications  . misoprostol (CYTOTEC) 100 MCG tablet    Sig: Take 1 tablet (100 mcg total) by mouth once for 1 dose. 1 hour before appt    Dispense:  1 tablet    Refill:  0    Order Specific Question:   Supervising Provider    Answer:   Nadara MustardHARRIS, ROBERT P [id number]]  . azithromycin (ZITHROMAX) 250 MG tablet    Sig: Take 4 tablets (1,000 mg total) by mouth once for 1 dose.    Dispense:  4 tablet    Refill:  0    Order Specific Question:   Supervising Provider    Answer:   Nadara MustardHARRIS, ROBERT P [[id number]]             GYN counsel STD prevention, adequate intake of calcium and vitamin D, diet and exercise     F/U  Return in about 1 month (around 08/23/2017) for IUD removal/replacement/chlamydia TOC.  Alicia B. Copland, PA-C 07/26/2017 11:52 AM

## 2017-07-26 NOTE — Telephone Encounter (Signed)
Pt coming for mirena replacement on 5/2 at 930 with ABC

## 2017-07-26 NOTE — Patient Instructions (Signed)
I value your feedback and entrusting us with your care. If you get a [facility] patient survey, I would appreciate you taking the time to let us know about your experience today. Thank you! 

## 2017-07-27 ENCOUNTER — Encounter: Payer: Self-pay | Admitting: Obstetrics and Gynecology

## 2017-07-27 LAB — HSV 2 ANTIBODY, IGG

## 2017-07-27 LAB — HIV ANTIBODY (ROUTINE TESTING W REFLEX): HIV SCREEN 4TH GENERATION: NONREACTIVE

## 2017-07-27 LAB — HEPATITIS C ANTIBODY

## 2017-07-27 LAB — RPR: RPR Ser Ql: NONREACTIVE

## 2017-07-27 NOTE — Telephone Encounter (Signed)
Noted. Will order to arrive by apt date/time. 

## 2017-08-01 LAB — IGP, RFX APTIMA HPV ASCU: PAP SMEAR COMMENT: 0

## 2017-08-22 NOTE — Telephone Encounter (Signed)
Mirena reserved for this patient. 

## 2017-08-23 ENCOUNTER — Encounter: Payer: Self-pay | Admitting: Obstetrics and Gynecology

## 2017-08-23 ENCOUNTER — Ambulatory Visit (INDEPENDENT_AMBULATORY_CARE_PROVIDER_SITE_OTHER): Payer: 59 | Admitting: Obstetrics and Gynecology

## 2017-08-23 VITALS — BP 132/88 | HR 83 | Ht 65.0 in | Wt 223.0 lb

## 2017-08-23 DIAGNOSIS — Z30431 Encounter for routine checking of intrauterine contraceptive device: Secondary | ICD-10-CM

## 2017-08-23 DIAGNOSIS — Z113 Encounter for screening for infections with a predominantly sexual mode of transmission: Secondary | ICD-10-CM | POA: Diagnosis not present

## 2017-08-23 DIAGNOSIS — N72 Inflammatory disease of cervix uteri: Secondary | ICD-10-CM

## 2017-08-23 DIAGNOSIS — A749 Chlamydial infection, unspecified: Secondary | ICD-10-CM | POA: Diagnosis not present

## 2017-08-23 MED ORDER — DOXYCYCLINE HYCLATE 100 MG PO CAPS: 100.0000 mg | ORAL_CAPSULE | Freq: Two times a day (BID) | ORAL | 0 refills | Status: DC

## 2017-08-23 NOTE — Progress Notes (Signed)
[place], [facility]   Chief Complaint  Patient presents with  . Contraception    IUD removal/reinsert     HPI:      Ms. Whitney Rojas is a 27 y.o. G1P1001 who LMP was No LMP recorded (lmp unknown). (Menstrual status: IUD)., presents today for chlamydia TOC and IUD replacement. Pt diagnosed with chlamydia 1/19 but never did abx due to cost. Rx azithro eRxd at 4/19 annual. Pt treated and has been abstinent since. Partner getting tested/treated today. Pt noticed increased d/c and odor resolved but has come back the past 2 days. Pt's cx was friable on 4/19 exam.   Her Mirena IUD is expired and pt here for removal/replacement today. Didn't take cytotec since unsure if we would do it. IUD strings not visible at 4/19 annual but IUD in place per u/s 1/19.   Past Medical History:  Diagnosis Date  . Anxiety   . Depression   . Migraine   . UTI (urinary tract infection)     Past Surgical History:  Procedure Laterality Date  . ADENOIDECTOMY    . TONSILLECTOMY      Family History  Problem Relation Age of Onset  . Hypertension Mother   . Heart disease Father   . Hypertension Father   . Heart disease Brother   . Hypertension Brother   . Melanoma Brother 37  . Sleep apnea Brother   . Cancer Maternal Grandmother   . Hypertension Maternal Grandfather   . Breast cancer Neg Hx   . Ovarian cancer Neg Hx     Social History   Socioeconomic History  . Marital status: Single    Spouse name: Not on file  . Number of children: Not on file  . Years of education: Not on file  . Highest education level: Not on file  Occupational History  . Not on file  Social Needs  . Financial resource strain: Not on file  . Food insecurity:    Worry: Not on file    Inability: Not on file  . Transportation needs:    Medical: Not on file    Non-medical: Not on file  Tobacco Use  . Smoking status: Current Every Day Smoker  . Smokeless tobacco: Never Used  Substance and Sexual Activity  .  Alcohol use: Yes  . Drug use: No  . Sexual activity: Yes    Birth control/protection: IUD    Comment: Mirena   Lifestyle  . Physical activity:    Days per week: Not on file    Minutes per session: Not on file  . Stress: Not on file  Relationships  . Social connections:    Talks on phone: Not on file    Gets together: Not on file    Attends religious service: Not on file    Active member of club or organization: Not on file    Attends meetings of clubs or organizations: Not on file    Relationship status: Not on file  . Intimate partner violence:    Fear of current or ex partner: Not on file    Emotionally abused: Not on file    Physically abused: Not on file    Forced sexual activity: Not on file  Other Topics Concern  . Not on file  Social History Narrative  . Not on file    Outpatient Medications Prior to Visit  Medication Sig Dispense Refill  . levonorgestrel (MIRENA) 20 MCG/24HR IUD 1 each by Intrauterine route once.    Mar[facility] Kitchen  azithromycin (ZITHROMAX) 250 MG tablet   0  . metroNIDAZOLE (FLAGYL) 500 MG tablet Take 2 tabs BID for 1 day (Patient not taking: Reported on 08/23/2017) 4 tablet 0  . misoprostol (CYTOTEC) 100 MCG tablet Take 1 tablet (100 mcg total) by mouth once for 1 dose. 1 hour before appt 1 tablet 0  . misoprostol (CYTOTEC) 100 MCG tablet   0   No facility-administered medications prior to visit.     ROS:  Review of Systems  Constitutional: Negative for fever.  Gastrointestinal: Negative for blood in stool, constipation, diarrhea, nausea and vomiting.  Genitourinary: Positive for vaginal discharge. Negative for dyspareunia, dysuria, flank pain, frequency, hematuria, urgency, vaginal bleeding and vaginal pain.  Musculoskeletal: Negative for back pain.  Skin: Negative for rash.    OBJECTIVE:   Vitals:  BP 132/88   Pulse 83   Ht  (1.651 m)   Wt 223 lb (101.2 kg)   LMP  (LMP Unknown)   BMI 37.11 kg/m   Physical Exam  Constitutional: She is  oriented to person, place, and time. Vital signs are normal. She appears well-developed.  Pulmonary/Chest: Effort normal.  Genitourinary: Vagina normal and uterus normal. There is no rash, tenderness or lesion on the right labia. There is no rash, tenderness or lesion on the left labia. Uterus is not enlarged and not tender. Cervix exhibits discharge and friability. Cervix exhibits no motion tenderness. Right adnexum displays no mass and no tenderness. Left adnexum displays no mass and no tenderness. No erythema or tenderness in the vagina. No vaginal discharge found.  Musculoskeletal: Normal range of motion.  Neurological: She is alert and oriented to person, place, and time.  Psychiatric: She has a normal mood and affect. Her behavior is normal. Thought content normal.  Vitals reviewed.   Assessment/Plan: Chlamydia - TOC today. Will call with results. - Plan: Chlamydia/Gonococcus/Trichomonas, NAA  Screening for STD (sexually transmitted disease) - Plan: Chlamydia/Gonococcus/Trichomonas, NAA  Cervicitis - Friable cervix and very tender. Pt can't tolerate IUD removal/replacement today. Rx doxy. RTO in 2 wks for IUD replacement.  - Plan: doxycycline (VIBRAMYCIN) 100 MG capsule  Encounter for management of intrauterine contraceptive device (IUD), unspecified IUD management type - RTO in 2 wks after treating for cervicitis for IUD replacement. Take cytotec (1 hr before) and NSAIDs.    Meds ordered this encounter  Medications  . doxycycline (VIBRAMYCIN) 100 MG capsule    Sig: Take 1 capsule (100 mg total) by mouth 2 (two) times daily.    Dispense:  14 capsule    Refill:  0    Order Specific Question:   Supervising Provider    Answer:   Nadara Mustard [[id number]]     Return in about 2 weeks (around 09/06/2017) for Mirena IUD removal/replacement.  Alicia B. Copland, PA-C 08/23/2017 10:33 AM

## 2017-08-23 NOTE — Patient Instructions (Signed)
I value your feedback and entrusting us with your care. If you get a [facility] patient survey, I would appreciate you taking the time to let us know about your experience today. Thank you! 

## 2017-08-24 ENCOUNTER — Encounter: Payer: Self-pay | Admitting: Obstetrics and Gynecology

## 2017-08-27 LAB — CHLAMYDIA/GONOCOCCUS/TRICHOMONAS, NAA
Chlamydia by NAA: NEGATIVE
Gonococcus by NAA: NEGATIVE
TRICH VAG BY NAA: NEGATIVE

## 2017-09-05 NOTE — Telephone Encounter (Signed)
Pt was r/s to 09/06/17

## 2017-09-06 ENCOUNTER — Ambulatory Visit: Payer: 59 | Admitting: Obstetrics and Gynecology

## 2017-11-15 NOTE — Telephone Encounter (Signed)
Pt no showed 09/06/17 apt

## 2017-12-23 DIAGNOSIS — F419 Anxiety disorder, unspecified: Secondary | ICD-10-CM

## 2017-12-23 HISTORY — DX: Anxiety disorder, unspecified: F41.9

## 2017-12-30 ENCOUNTER — Other Ambulatory Visit: Payer: Self-pay

## 2017-12-30 ENCOUNTER — Emergency Department
Admission: EM | Admit: 2017-12-30 | Discharge: 2017-12-30 | Disposition: A | Discharge status: Home / Self Care | Payer: 59 | Attending: Emergency Medicine | Admitting: Emergency Medicine

## 2017-12-30 ENCOUNTER — Emergency Department: Payer: 59

## 2017-12-30 ENCOUNTER — Encounter: Payer: Self-pay | Admitting: Emergency Medicine

## 2017-12-30 DIAGNOSIS — F410 Panic disorder [episodic paroxysmal anxiety] without agoraphobia: Secondary | ICD-10-CM | POA: Diagnosis not present

## 2017-12-30 DIAGNOSIS — F419 Anxiety disorder, unspecified: Secondary | ICD-10-CM | POA: Insufficient documentation

## 2017-12-30 DIAGNOSIS — R079 Chest pain, unspecified: Secondary | ICD-10-CM | POA: Diagnosis present

## 2017-12-30 DIAGNOSIS — F17200 Nicotine dependence, unspecified, uncomplicated: Secondary | ICD-10-CM | POA: Diagnosis not present

## 2017-12-30 LAB — BASIC METABOLIC PANEL
ANION GAP: 8 (ref 5–15)
BUN: 14 mg/dL (ref 6–20)
CO2: 26 mmol/L (ref 22–32)
Calcium: 9.6 mg/dL (ref 8.9–10.3)
Chloride: 102 mmol/L (ref 98–111)
Creatinine, Ser: 0.8 mg/dL (ref 0.44–1.00)
GFR calc Af Amer: >60 mL/min (ref >60)
GLUCOSE: 108 mg/dL — AB (ref 70–99)
POTASSIUM: 4.6 mmol/L (ref 3.5–5.1)
Sodium: 136 mmol/L (ref 135–145)

## 2017-12-30 LAB — CBC
HCT: 44.4 % (ref 35.0–47.0)
HEMOGLOBIN: 15.7 g/dL (ref 12.0–16.0)
MCH: 31.6 pg (ref 26.0–34.0)
MCHC: 35.4 g/dL (ref 32.0–36.0)
MCV: 89.2 fL (ref 80.0–100.0)
Platelets: 444 10*3/uL — ABNORMAL HIGH (ref 150–440)
RBC: 4.98 MIL/uL (ref 3.80–5.20)
RDW: 13.6 % (ref 11.5–14.5)
WBC: 10.2 10*3/uL (ref 3.6–11.0)

## 2017-12-30 LAB — POCT PREGNANCY, URINE: Preg Test, Ur: NEGATIVE

## 2017-12-30 LAB — TROPONIN I

## 2017-12-30 MED ORDER — DIAZEPAM 5 MG PO TABS
5.0000 mg | ORAL_TABLET | Freq: Once | ORAL | Status: AC
  Administered 2017-12-30: 5 mg via ORAL
  Filled 2017-12-30: qty 1

## 2017-12-30 MED ORDER — LORAZEPAM 1 MG PO TABS: 1.0000 mg | ORAL_TABLET | Freq: Two times a day (BID) | ORAL | 0 refills | Status: DC | PRN

## 2017-12-30 NOTE — ED Provider Notes (Signed)
[facility] Emergency Department Provider Note       Time seen: ----------------------------------------- 9:49 PM on 12/30/2017 -----------------------------------------   I have reviewed the triage vital signs and the nursing notes.  HISTORY   Chief Complaint Chest Pain and Anxiety    HPI Whitney Rojas is a 27 y.o. female with a history of anxiety, depression, migraine, UTI who presents to the ED for pain in the center of her chest.  Patient reports the pain is tight in nature.  She was crying in triage and breathing fast.  She had been encouraged to slow her breathing down.  She reports a history of anxiety and states she has had increased stress in her life recently due to her mom's illness.  She denies fevers, chills or other complaints.  Past Medical History:  Diagnosis Date  . Anxiety   . Depression   . Migraine   . UTI (urinary tract infection)     Patient Active Problem List   Diagnosis Date Noted  . Positive Chlamyida test 05/19/2017    Past Surgical History:  Procedure Laterality Date  . ADENOIDECTOMY    . TONSILLECTOMY      Allergies Patient has no known allergies.  Social History Social History   Tobacco Use  . Smoking status: Current Every Day Smoker  . Smokeless tobacco: Never Used  Substance Use Topics  . Alcohol use: Yes  . Drug use: No   Review of Systems Constitutional: Negative for fever. Cardiovascular: Positive for chest pain Respiratory: Negative for shortness of breath. Gastrointestinal: Negative for abdominal pain, vomiting and diarrhea. Genitourinary: Negative for dysuria. Musculoskeletal: Negative for back pain. Skin: Negative for rash. Neurological: Negative for headaches, focal weakness or numbness. Psychiatric: Positive for anxiety  All systems negative/normal/unremarkable except as stated in the HPI  ____________________________________________   PHYSICAL EXAM:  VITAL SIGNS: ED Triage Vitals   Enc Vitals Group     BP 12/30/17 2050 139/87     Pulse Rate 12/30/17 2050 88     Resp 12/30/17 2050 20     Temp 12/30/17 2050 (!) 97.5 F (36.4 C)     Temp Source 12/30/17 2050 Oral     SpO2 12/30/17 2050 100 %     Weight 12/30/17 2051 240 lb (108.9 kg)     Height 12/30/17 2051 5\' 5"  (1.651 m)     Head Circumference --      Peak Flow --      Pain Score 12/30/17 2050 4     Pain Loc --      Pain Edu? --      Excl. in GC? --    Constitutional: Alert and oriented. Well appearing and in no distress. Eyes: Conjunctivae are normal. Normal extraocular movements. Cardiovascular: Normal rate, regular rhythm. No murmurs, rubs, or gallops. Respiratory: Normal respiratory effort without tachypnea nor retractions. Breath sounds are clear and equal bilaterally. No wheezes/rales/rhonchi. Gastrointestinal: Soft and nontender. Normal bowel sounds Musculoskeletal: Nontender with normal range of motion in extremities. No lower extremity tenderness nor edema. Neurologic:  Normal speech and language. No gross focal neurologic deficits are appreciated.  Skin:  Skin is warm, dry and intact. No rash noted. Psychiatric: Anxious, and otherwise normal ____________________________________________  EKG: Interpreted by me.  Sinus rhythm the rate of 78 bpm, normal PR interval, normal QRS, normal QT  ____________________________________________  ED COURSE:  As part of my medical decision making, I reviewed the following data within the electronic MEDICAL RECORD NUMBER History obtained from family  if available, nursing notes, old chart and ekg, as well as notes from prior ED visits. Patient presented for chest pain which is likely due to a panic attack, we will assess with labs and imaging as indicated at this time.   Procedures ____________________________________________   LABS (pertinent positives/negatives)  Labs Reviewed  BASIC METABOLIC PANEL - Abnormal; Notable for the following components:      Result  Value   Glucose, Bld 108 (*)    All other components within normal limits  CBC - Abnormal; Notable for the following components:   Platelets 444 (*)    All other components within normal limits  TROPONIN I  POCT PREGNANCY, URINE  POC URINE PREG, ED    RADIOLOGY Images were viewed by me  Does not reveal any acute process ____________________________________________  DIFFERENTIAL DIAGNOSIS   Musculoskeletal pain, anxiety, GERD, panic attack, unstable angina unlikely  FINAL ASSESSMENT AND PLAN  Panic attack   Plan: The patient had presented for chest pain and presented like a panic attack. Patient's labs were normal. Patient's imaging was also unremarkable.  She was given oral Valium and will be discharged with as needed Ativan.   Ulice Dash, MD   Note: This note was generated in part or whole with voice recognition software. Voice recognition is usually quite accurate but there are transcription errors that can and very often do occur. I apologize for any typographical errors that were not detected and corrected.     Emily Filbert, MD 12/30/17 2152

## 2017-12-30 NOTE — ED Notes (Signed)
Patient transported to X-ray 

## 2017-12-30 NOTE — ED Notes (Signed)
Pt verbalized she had a ride home before taking medication. Pt states boyfriend will be taking her home.

## 2017-12-30 NOTE — ED Triage Notes (Signed)
Pt reports last pm started with CP to the center of her chest. Pt reports pain is tight in nature. Pt crying in triage and breathing fast. Encouraged pt to slow down her breathing. Pt reports hx of anxiety and states she has some stress in her life currently.

## 2018-02-20 ENCOUNTER — Other Ambulatory Visit (HOSPITAL_COMMUNITY)
Admission: RE | Admit: 2018-02-20 | Discharge: 2018-02-20 | Disposition: A | Discharge status: Home / Self Care | Payer: 59 | Source: Ambulatory Visit | Attending: Obstetrics and Gynecology | Admitting: Obstetrics and Gynecology

## 2018-02-20 ENCOUNTER — Ambulatory Visit (INDEPENDENT_AMBULATORY_CARE_PROVIDER_SITE_OTHER): Payer: 59 | Admitting: Obstetrics and Gynecology

## 2018-02-20 ENCOUNTER — Encounter: Payer: Self-pay | Admitting: Obstetrics and Gynecology

## 2018-02-20 VITALS — BP 110/80 | HR 66 | Ht 65.0 in | Wt 221.0 lb

## 2018-02-20 DIAGNOSIS — Z30431 Encounter for routine checking of intrauterine contraceptive device: Secondary | ICD-10-CM

## 2018-02-20 DIAGNOSIS — Z113 Encounter for screening for infections with a predominantly sexual mode of transmission: Secondary | ICD-10-CM

## 2018-02-20 DIAGNOSIS — R102 Pelvic and perineal pain: Secondary | ICD-10-CM | POA: Diagnosis not present

## 2018-02-20 DIAGNOSIS — Z3202 Encounter for pregnancy test, result negative: Secondary | ICD-10-CM | POA: Diagnosis not present

## 2018-02-20 LAB — POCT URINE PREGNANCY: Preg Test, Ur: NEGATIVE

## 2018-02-20 NOTE — Patient Instructions (Signed)
I value your feedback and entrusting us with your care. If you get a [facility] patient survey, I would appreciate you taking the time to let us know about your experience today. Thank you! 

## 2018-02-20 NOTE — Progress Notes (Signed)
[place], [facility]   Chief Complaint  Patient presents with  . Pelvic Pain    has had cyst before and the pain was like the pain she had with the cyst, this pain comes and goes x one month, pain stays in pelvic area only, states IUD is overdue,    HPI:      Ms. Whitney Rojas is a 27 y.o. G1P1001 who LMP was No LMP recorded. (Menstrual status: IUD)., presents today for pelvic pain yesterday. Pain was sharp and crampy, resolved with NSAIDs. No sx today. No vag sx, urin sx. Has had some loose stools due to amox use. No fevers. Hx of ovar cysts in past. Hx of chlamydia in past. She and partner did tx and pt had neg test of cure 5/19, but wants to be tested again today. Will also want blood STD testing in future.   Pt is sex active, has expired Mirena. IUD placed 04/25/12. IUD strings in cx os per GYN u/s. Attempted removal and replacement  5/19 but pt had cx friability at that time and couldn't tolerate procedure. Has not been using condoms in meantime.  Is amenorrheic with IUD.   Past Medical History:  Diagnosis Date  . Anxiety 12/2017  . Chlamydia   . Depression   . Migraine   . UTI (urinary tract infection)     Past Surgical History:  Procedure Laterality Date  . ADENOIDECTOMY    . TONSILLECTOMY    . WISDOM TOOTH EXTRACTION      Family History  Problem Relation Age of Onset  . Hypertension Mother   . Heart disease Father   . Hypertension Father   . Heart disease Brother   . Hypertension Brother   . Melanoma Brother 37  . Sleep apnea Brother   . Cancer Maternal Grandmother   . Hypertension Maternal Grandmother   . Hypertension Maternal Grandfather   . Breast cancer Neg Hx   . Ovarian cancer Neg Hx     Social History   Socioeconomic History  . Marital status: Single    Spouse name: Not on file  . Number of children: Not on file  . Years of education: Not on file  . Highest education level: Not on file  Occupational History  . Not on file  Social Needs    . Financial resource strain: Not on file  . Food insecurity:    Worry: Not on file    Inability: Not on file  . Transportation needs:    Medical: Not on file    Non-medical: Not on file  Tobacco Use  . Smoking status: Current Every Day Smoker  . Smokeless tobacco: Never Used  Substance and Sexual Activity  . Alcohol use: Yes  . Drug use: No  . Sexual activity: Yes    Birth control/protection: IUD    Comment: Mirena   Lifestyle  . Physical activity:    Days per week: Not on file    Minutes per session: Not on file  . Stress: Not on file  Relationships  . Social connections:    Talks on phone: Not on file    Gets together: Not on file    Attends religious service: Not on file    Active member of club or organization: Not on file    Attends meetings of clubs or organizations: Not on file    Relationship status: Not on file  . Intimate partner violence:    Fear of current or  ex partner: Not on file    Emotionally abused: Not on file    Physically abused: Not on file    Forced sexual activity: Not on file  Other Topics Concern  . Not on file  Social History Narrative  . Not on file    Outpatient Medications Prior to Visit  Medication Sig Dispense Refill  . albuterol (PROVENTIL HFA;VENTOLIN HFA) 108 (90 Base) MCG/ACT inhaler Inhale into the lungs.    Marland Kitchen amoxicillin-clavulanate (AUGMENTIN) 875-125 MG tablet Take 1 tablet by mouth every 12 (twelve) hours.  0  . ipratropium (ATROVENT) 0.03 % nasal spray Place into the nose.    . levonorgestrel (MIRENA) 20 MCG/24HR IUD 1 each by Intrauterine route once.    . trimethoprim-polymyxin b (POLYTRIM) ophthalmic solution INSTILL 1 DROP INTO EACH EYE EVERY 4 HOURS  0  . azithromycin (ZITHROMAX) 250 MG tablet   0  . doxycycline (VIBRAMYCIN) 100 MG capsule Take 1 capsule (100 mg total) by mouth 2 (two) times daily. 14 capsule 0  . LORazepam (ATIVAN) 1 MG tablet Take 1 tablet (1 mg total) by mouth 2 (two) times daily as needed for  anxiety. 20 tablet 0  . metroNIDAZOLE (FLAGYL) 500 MG tablet Take 2 tabs BID for 1 day (Patient not taking: Reported on 08/23/2017) 4 tablet 0  . misoprostol (CYTOTEC) 100 MCG tablet Take 1 tablet (100 mcg total) by mouth once for 1 dose. 1 hour before appt 1 tablet 0  . misoprostol (CYTOTEC) 100 MCG tablet   0   No facility-administered medications prior to visit.       ROS:  Review of Systems  Constitutional: Negative for fever.  Gastrointestinal: Negative for blood in stool, constipation, diarrhea, nausea and vomiting.  Genitourinary: Positive for pelvic pain. Negative for dyspareunia, dysuria, flank pain, frequency, hematuria, urgency, vaginal bleeding, vaginal discharge and vaginal pain.  Musculoskeletal: Negative for back pain.  Skin: Negative for rash.    OBJECTIVE:   Vitals:  BP 110/80   Pulse 66   Ht 5\' 5"  (1.651 m)   Wt 221 lb (100.2 kg)   BMI 36.78 kg/m   Physical Exam  Constitutional: She is oriented to person, place, and time. Vital signs are normal. She appears well-developed.  Pulmonary/Chest: Effort normal.  Abdominal: Soft. There is no tenderness.  Genitourinary: Vagina normal and uterus normal. There is no rash, tenderness or lesion on the right labia. There is no rash, tenderness or lesion on the left labia. Uterus is not enlarged and not tender. Cervix exhibits motion tenderness. Right adnexum displays no mass and no tenderness. Left adnexum displays no mass and no tenderness. No erythema or tenderness in the vagina. No vaginal discharge found.  Musculoskeletal: Normal range of motion.  Neurological: She is alert and oriented to person, place, and time.  Psychiatric: She has a normal mood and affect. Her behavior is normal. Thought content normal.  Vitals reviewed.   Results: Results for orders placed or performed in visit on 02/20/18 (from the past 24 hour(s))  POCT urine pregnancy     Status: Normal   Collection Time: 02/20/18 10:14 AM  Result Value  Ref Range   Preg Test, Ur Negative Negative     Assessment/Plan: Pelvic pain - Neg exam, sx resolved. Neg UPT. Check gon/chlam. Follow expectantly.  - Plan: POCT urine pregnancy  Screening for STD (sexually transmitted disease) - Gon/chlam today. Will call with results. Pt wants to come back another time for blood STD testing. Will call when desires.  -  Plan: Cervicovaginal ancillary only  Encounter for routine checking of intrauterine contraceptive device (IUD) - IUD expired/past due for removal. Pt to RTO for replacement. Will call for appt. Cytotec/NSAIDs 1 hr before appt. Condoms in meantime.      Return if symptoms worsen or fail to improve.  Alicia B. Copland, PA-C 02/20/2018 10:17 AM

## 2018-02-21 LAB — CERVICOVAGINAL ANCILLARY ONLY
CHLAMYDIA, DNA PROBE: NEGATIVE
Neisseria Gonorrhea: NEGATIVE
TRICH (WINDOWPATH): NEGATIVE

## 2018-02-22 NOTE — Progress Notes (Signed)
Pls let pt know STD testing neg. Thx.

## 2018-02-25 NOTE — Progress Notes (Signed)
Tried again, same msg.

## 2018-02-25 NOTE — Progress Notes (Signed)
Tried it a 3rd time, call still not going through, answering machine answers right away.

## 2018-02-25 NOTE — Progress Notes (Signed)
Called pt and went straight to VM stating number is not taking calls at this moment.

## 2018-07-29 ENCOUNTER — Ambulatory Visit: Payer: 59 | Admitting: Obstetrics and Gynecology

## 2018-11-12 ENCOUNTER — Telehealth: Payer: Self-pay

## 2018-11-12 ENCOUNTER — Telehealth: Payer: Self-pay | Admitting: Advanced Practice Midwife

## 2018-11-12 ENCOUNTER — Other Ambulatory Visit: Payer: Self-pay | Admitting: Advanced Practice Midwife

## 2018-11-12 DIAGNOSIS — Z30430 Encounter for insertion of intrauterine contraceptive device: Secondary | ICD-10-CM

## 2018-11-12 MED ORDER — MISOPROSTOL 200 MCG PO TABS: 400.0000 ug | ORAL_TABLET | Freq: Once | ORAL | 0 refills | Status: DC

## 2018-11-12 NOTE — Telephone Encounter (Addendum)
Noted. Per Epic, apt is 11/20/2018 not 12/21/2018.

## 2018-11-12 NOTE — Progress Notes (Unsigned)
Rx misoprostol sent to patient's pharmacy for IUD insertion on 7/29.

## 2018-11-12 NOTE — Telephone Encounter (Signed)
Tried to call pt. Please let me know when she calls back or relay msg from [place]. Thank you

## 2018-11-12 NOTE — Telephone Encounter (Signed)
Rx misoprostol sent to patient for IUD insertion on 7/29

## 2018-11-12 NOTE — Telephone Encounter (Signed)
Patient is schedule opn Wednesday, 12/21/18 at 3:50 for Annual and Mirena replacment with JEG

## 2018-11-12 NOTE — Telephone Encounter (Signed)
Pt calling for cytotec rx to be sent in.  Has appt Wed the 29th at 3:50 for IUD insertion.  [phone]

## 2018-11-20 ENCOUNTER — Encounter: Payer: Self-pay | Admitting: Advanced Practice Midwife

## 2018-11-20 ENCOUNTER — Ambulatory Visit (INDEPENDENT_AMBULATORY_CARE_PROVIDER_SITE_OTHER): Payer: 59 | Admitting: Advanced Practice Midwife

## 2018-11-20 ENCOUNTER — Other Ambulatory Visit: Payer: Self-pay

## 2018-11-20 ENCOUNTER — Other Ambulatory Visit (HOSPITAL_COMMUNITY)
Admission: RE | Admit: 2018-11-20 | Discharge: 2018-11-20 | Disposition: A | Discharge status: Home / Self Care | Payer: 59 | Source: Ambulatory Visit | Attending: Advanced Practice Midwife | Admitting: Advanced Practice Midwife

## 2018-11-20 VITALS — BP 128/94 | Ht 65.0 in | Wt 192.0 lb

## 2018-11-20 DIAGNOSIS — Z01419 Encounter for gynecological examination (general) (routine) without abnormal findings: Secondary | ICD-10-CM

## 2018-11-20 DIAGNOSIS — Z720 Tobacco use: Secondary | ICD-10-CM | POA: Insufficient documentation

## 2018-11-20 DIAGNOSIS — Z124 Encounter for screening for malignant neoplasm of cervix: Secondary | ICD-10-CM

## 2018-11-20 DIAGNOSIS — T8332XA Displacement of intrauterine contraceptive device, initial encounter: Secondary | ICD-10-CM

## 2018-11-20 DIAGNOSIS — Z113 Encounter for screening for infections with a predominantly sexual mode of transmission: Secondary | ICD-10-CM

## 2018-11-20 NOTE — Patient Instructions (Signed)
Health Maintenance, Female Adopting a healthy lifestyle and getting preventive care are important in promoting health and wellness. Ask your health care provider about:  The right schedule for you to have regular tests and exams.  Things you can do on your own to prevent diseases and keep yourself healthy. What should I know about diet, weight, and exercise? Eat a healthy diet   Eat a diet that includes plenty of vegetables, fruits, low-fat dairy products, and lean protein.  Do not eat a lot of foods that are high in solid fats, added sugars, or sodium. Maintain a healthy weight Body mass index (BMI) is used to identify weight problems. It estimates body fat based on height and weight. Your health care provider can help determine your BMI and help you achieve or maintain a healthy weight. Get regular exercise Get regular exercise. This is one of the most important things you can do for your health. Most adults should:  Exercise for at least 150 minutes each week. The exercise should increase your heart rate and make you sweat (moderate-intensity exercise).  Do strengthening exercises at least twice a week. This is in addition to the moderate-intensity exercise.  Spend less time sitting. Even light physical activity can be beneficial. Watch cholesterol and blood lipids Have your blood tested for lipids and cholesterol at 28 years of age, then have this test every 5 years. Have your cholesterol levels checked more often if:  Your lipid or cholesterol levels are high.  You are older than 28 years of age.  You are at high risk for heart disease. What should I know about cancer screening? Depending on your health history and family history, you may need to have cancer screening at various ages. This may include screening for:  Breast cancer.  Cervical cancer.  Colorectal cancer.  Skin cancer.  Lung cancer. What should I know about heart disease, diabetes, and high blood  pressure? Blood pressure and heart disease  High blood pressure causes heart disease and increases the risk of stroke. This is more likely to develop in people who have high blood pressure readings, are of African descent, or are overweight.  Have your blood pressure checked: ? Every 3-5 years if you are 18-39 years of age. ? Every year if you are 40 years old or older. Diabetes Have regular diabetes screenings. This checks your fasting blood sugar level. Have the screening done:  Once every three years after age 40 if you are at a normal weight and have a low risk for diabetes.  More often and at a younger age if you are overweight or have a high risk for diabetes. What should I know about preventing infection? Hepatitis B If you have a higher risk for hepatitis B, you should be screened for this virus. Talk with your health care provider to find out if you are at risk for hepatitis B infection. Hepatitis C Testing is recommended for:  Everyone born from 1945 through 1965.  Anyone with known risk factors for hepatitis C. Sexually transmitted infections (STIs)  Get screened for STIs, including gonorrhea and chlamydia, if: ? You are sexually active and are younger than 28 years of age. ? You are older than 28 years of age and your health care provider tells you that you are at risk for this type of infection. ? Your sexual activity has changed since you were last screened, and you are at increased risk for chlamydia or gonorrhea. Ask your health care provider if   you are at risk.  Ask your health care provider about whether you are at high risk for HIV. Your health care provider may recommend a prescription medicine to help prevent HIV infection. If you choose to take medicine to prevent HIV, you should first get tested for HIV. You should then be tested every 3 months for as long as you are taking the medicine. Pregnancy  If you are about to stop having your period (premenopausal) and  you may become pregnant, seek counseling before you get pregnant.  Take 400 to 800 micrograms (mcg) of folic acid every day if you become pregnant.  Ask for birth control (contraception) if you want to prevent pregnancy. Osteoporosis and menopause Osteoporosis is a disease in which the bones lose minerals and strength with aging. This can result in bone fractures. If you are 65 years old or older, or if you are at risk for osteoporosis and fractures, ask your health care provider if you should:  Be screened for bone loss.  Take a calcium or vitamin D supplement to lower your risk of fractures.  Be given hormone replacement therapy (HRT) to treat symptoms of menopause. Follow these instructions at home: Lifestyle  Do not use any products that contain nicotine or tobacco, such as cigarettes, e-cigarettes, and chewing tobacco. If you need help quitting, ask your health care provider.  Do not use street drugs.  Do not share needles.  Ask your health care provider for help if you need support or information about quitting drugs. Alcohol use  Do not drink alcohol if: ? Your health care provider tells you not to drink. ? You are pregnant, may be pregnant, or are planning to become pregnant.  If you drink alcohol: ? Limit how much you use to 0-1 drink a day. ? Limit intake if you are breastfeeding.  Be aware of how much alcohol is in your drink. In the [place] one drink equals one 12 oz bottle of beer (355 mL), one 5 oz glass of wine (148 mL), or one 1 oz glass of hard liquor (44 mL). General instructions  Schedule regular health, dental, and eye exams.  Stay current with your vaccines.  Tell your health care provider if: ? You often feel depressed. ? You have ever been abused or do not feel safe at home. Summary  Adopting a healthy lifestyle and getting preventive care are important in promoting health and wellness.  Follow your health care provider's instructions about healthy  diet, exercising, and getting tested or screened for diseases.  Follow your health care provider's instructions on monitoring your cholesterol and blood pressure. This information is not intended to replace advice given to you by your health care provider. Make sure you discuss any questions you have with your health care provider. Document Released: 10/24/2010 Document Revised: 04/03/2018 Document Reviewed: 04/03/2018 Elsevier Patient Education  2020 Elsevier Inc.  

## 2018-11-21 ENCOUNTER — Other Ambulatory Visit: Payer: 59

## 2018-11-21 ENCOUNTER — Ambulatory Visit: Payer: 59 | Admitting: Obstetrics & Gynecology

## 2018-11-21 NOTE — Telephone Encounter (Signed)
Pt unable to tolerate pelvic exam. Mirena not used.

## 2018-11-21 NOTE — Progress Notes (Signed)
Gynecology Annual Exam   Date of service: 11/20/2018   [place]  Chief Complaint:  Chief Complaint  Patient presents with  . Annual Exam  . Contraception    History of Present Illness: Patient is a 28 y.o. G1P1001 presents for annual exam. The patient has complaints today of inflamed hair follicle on areola. She admits squeezing 1 particular bump on her left areola. She admits she is able to squeeze some fluid out but denies infection. She has pain with intercourse and a small amount of bleeding times 1 incident last night. She has recurring s/s of vaginitis but no current concerns.   Her IUD has been in place since 04/25/2012. She requests removal and replacement today due to losing her health insurance soon. She has a new partner since her last PAP. She has a history of Chlamydia. She is requesting PAP smear and STD testing today. She only occasionally uses condoms. Attempt has been made at previous visit to remove and replace the IUD. In April 2019 attempt to remove IUD was delayed due to Chlamydia infection. In May 2019 patient did not tolerate procedure.  Strings have not been visible but per ultrasound in January 2019 IUD was in place. Please see previous notes. She did take prescribed cytotec 4 hours before today's visit.  LMP: No LMP recorded. (Menstrual status: IUD). Average Interval: every 3 months  Duration of flow: 1 days Heavy Menses: spotting Clots: no Intermenstrual Bleeding: no Postcoital Bleeding: yes Dysmenorrhea: no  The patient is sexually active. She currently uses IUD for contraception. She admits to dyspareunia.  The patient does perform self breast exams.  There is no notable family history of breast or ovarian cancer in her family.  The patient wears seatbelts: yes.   The patient has regular exercise: she admits regular physical activity around her house including swimming. She tries to eat a healthy diet. She likes fruit. She admits  adequate hydration. She does not sleep well. She smokes between 1/2 to 1 PPD cigarettes. She has considered quitting but is not currently ready.    The patient denies current symptoms of depression.  She does admit anxiety and says "grounding" helps her cope.  Review of Systems: Review of Systems  Constitutional: Negative.   HENT: Negative.   Eyes: Negative.   Respiratory: Negative.   Cardiovascular: Negative.   Gastrointestinal: Positive for diarrhea.  Genitourinary:       Pain with intercourse   Musculoskeletal: Negative.   Skin:       Inflamed hair follicle on breast  Neurological: Negative.   Endo/Heme/Allergies: Negative.   Psychiatric/Behavioral:       Anxiety     Past Medical History:  Past Medical History:  Diagnosis Date  . Anxiety 12/2017  . Chlamydia   . Depression   . Migraine   . UTI (urinary tract infection)     Past Surgical History:  Past Surgical History:  Procedure Laterality Date  . ADENOIDECTOMY    . TONSILLECTOMY    . WISDOM TOOTH EXTRACTION      Gynecologic History:  No LMP recorded. (Menstrual status: IUD). Contraception: IUD Last Pap: 1 year ago Results were: no abnormalities   Obstetric History: G1P1001  Family History:  Family History  Problem Relation Age of Onset  . Hypertension Mother   . Heart disease Father   . Hypertension Father   . Heart disease Brother   . Hypertension Brother   . Melanoma Brother 37  .  Sleep apnea Brother   . Cancer Maternal Grandmother   . Hypertension Maternal Grandmother   . Hypertension Maternal Grandfather   . Breast cancer Neg Hx   . Ovarian cancer Neg Hx     Social History:  Social History   Socioeconomic History  . Marital status: Single    Spouse name: Not on file  . Number of children: Not on file  . Years of education: Not on file  . Highest education level: Not on file  Occupational History  . Not on file  Social Needs  . Financial resource strain: Not on file  . Food  insecurity    Worry: Not on file    Inability: Not on file  . Transportation needs    Medical: Not on file    Non-medical: Not on file  Tobacco Use  . Smoking status: Current Every Day Smoker  . Smokeless tobacco: Never Used  Substance and Sexual Activity  . Alcohol use: Yes  . Drug use: No  . Sexual activity: Yes    Birth control/protection: I.U.D.    Comment: Mirena   Lifestyle  . Physical activity    Days per week: Not on file    Minutes per session: Not on file  . Stress: Not on file  Relationships  . Social Herbalist on phone: Not on file    Gets together: Not on file    Attends religious service: Not on file    Active member of club or organization: Not on file    Attends meetings of clubs or organizations: Not on file    Relationship status: Not on file  . Intimate partner violence    Fear of current or ex partner: Not on file    Emotionally abused: Not on file    Physically abused: Not on file    Forced sexual activity: Not on file  Other Topics Concern  . Not on file  Social History Narrative  . Not on file    Allergies:  No Known Allergies  Medications: Prior to Admission medications   Medication Sig Start Date End Date Taking? Authorizing Provider  levonorgestrel (MIRENA) 20 MCG/24HR IUD 1 each by Intrauterine route once.   Yes [provider]  albuterol (PROVENTIL HFA;VENTOLIN HFA) 108 (90 Base) MCG/ACT inhaler Inhale into the lungs. 01/30/18 01/30/19  [provider]  augmented betamethasone dipropionate (DIPROLENE-AF) 0.05 % cream APPLY CREAM TOPICALLY TWICE DAILY TO AFFECTED AREA UNTIL CLEAR 11/12/18   [provider]  ipratropium (ATROVENT) 0.03 % nasal spray Place into the nose. 01/30/18 01/30/19  [provider]  trimethoprim-polymyxin b (POLYTRIM) ophthalmic solution INSTILL 1 DROP INTO EACH EYE EVERY 4 HOURS 01/30/18   [provider]    Physical Exam Vitals: Blood pressure (!) 128/94, height 5'  5" (1.651 m), weight 192 lb (87.1 kg).  General: NAD HEENT: normocephalic, anicteric Thyroid: no enlargement, no palpable nodules Pulmonary: No increased work of breathing, CTAB Cardiovascular: RRR, distal pulses 2+ Breast: Breast symmetrical, no tenderness, no palpable nodules or masses, no skin or nipple retraction present, no nipple discharge.  No axillary or supraclavicular lymphadenopathy. Montgomery tubercles present- slightly enlarged bump on left areola. No sign of infection. Abdomen: NABS, soft, non-tender, non-distended.  Umbilicus without lesions.  No hepatomegaly, splenomegaly or masses palpable. No evidence of hernia  Genitourinary:  External: Normal external female genitalia.  Normal urethral meatus, normal Bartholin's and Skene's glands.    Vagina: Normal vaginal mucosa, no evidence of prolapse.  Cervix: inflammation present, mucous discharge, friable, columnar cells visible, IUD strings not visible  Uterus: Non-enlarged, mobile, normal contour.  No CMT  Adnexa: ovaries non-enlarged, no adnexal masses  Rectal: deferred  Lymphatic: no evidence of inguinal lymphadenopathy Extremities: no edema, erythema, or tenderness Neurologic: Grossly intact Psychiatric: mood appropriate, affect full  Female chaperone present for pelvic and breast  portions of the physical exam  IUD removal: attempt was made to remove the IUD today using IUD hook and was unsuccessful. Dr Tiburcio PeaHarris was consulted and also attempted to remove the IUD using curved hemostat. That attempt was also unsuccessful. Patient was unable to tolerate the procedure. Plan made to have ultrasound to locate IUD/strings and then plan removal.    Assessment: 28 y.o. G1P1001 routine annual exam  Plan: Problem List Items Addressed This Visit    None    Visit Diagnoses    Well woman exam with routine gynecological exam    -  Primary   Relevant Orders   Cytology - PAP   Screen for sexually transmitted diseases        Relevant Orders   Cytology - PAP   Intrauterine contraceptive device threads lost, initial encounter       Relevant Orders   US PELVIS TRANSVAGINAL NON-OB (TV ONLY)   Cervical cancer screening       Relevant Orders   Cytology - PAP      1) STI screening  was offered and accepted  2)  ASCCP guidelines and rationale discussed.  Patient opts for yearly screening interval  3) Contraception - the patient is currently using  IUD.  Her current IUD has been in use for 6.5 years and is due for replacement. Removal attempt today unsuccessful- ultrasound tomorrow with MD follow up for plan of care regarding removal/replacement.   4) Routine healthcare maintenance including cholesterol, diabetes screening discussed Declines  5) Return in about 1 day (around 11/21/2018) for ultrasound tomorrow with follow up regarding IUD removal.   Whitney Rojas, CNM [facility] 11/21/2018, 11:15 AM

## 2018-11-27 ENCOUNTER — Telehealth: Payer: Self-pay

## 2018-11-27 NOTE — Telephone Encounter (Signed)
Pt was calling triage line to see what her pap results were. I called to let her know that they have not come back yet and due to covid and a lot of testing being done right now things are taking much longer then normal.

## 2018-11-29 ENCOUNTER — Ambulatory Visit: Payer: 59 | Admitting: Advanced Practice Midwife

## 2018-12-02 LAB — CYTOLOGY - PAP
Chlamydia: NEGATIVE
Diagnosis: NEGATIVE
Neisseria Gonorrhea: NEGATIVE
Trichomonas: POSITIVE — AB

## 2018-12-09 ENCOUNTER — Other Ambulatory Visit: Payer: Self-pay | Admitting: Advanced Practice Midwife

## 2018-12-09 DIAGNOSIS — A5901 Trichomonal vulvovaginitis: Secondary | ICD-10-CM

## 2018-12-09 MED ORDER — METRONIDAZOLE 500 MG PO TABS: 2000.0000 mg | ORAL_TABLET | Freq: Once | ORAL | 0 refills | Status: AC

## 2018-12-09 NOTE — Progress Notes (Unsigned)
Rx metronidazole 2 grams sent to patient pharmacy. Reviewed lab results with her and the importance of her and her partner being treated to avoid passing infection back and forth.

## 2020-08-02 IMAGING — CR DG CHEST 2V
1 series · 2 of 2 positions shown · non-contrast
Comparison: None.

CLINICAL DATA: Chest pain.

EXAM:
CHEST - 2 VIEW

[Series 1: dg chest 2 view · 0.14mm/px · 2 of 2 slices shown]
[im 1/2]
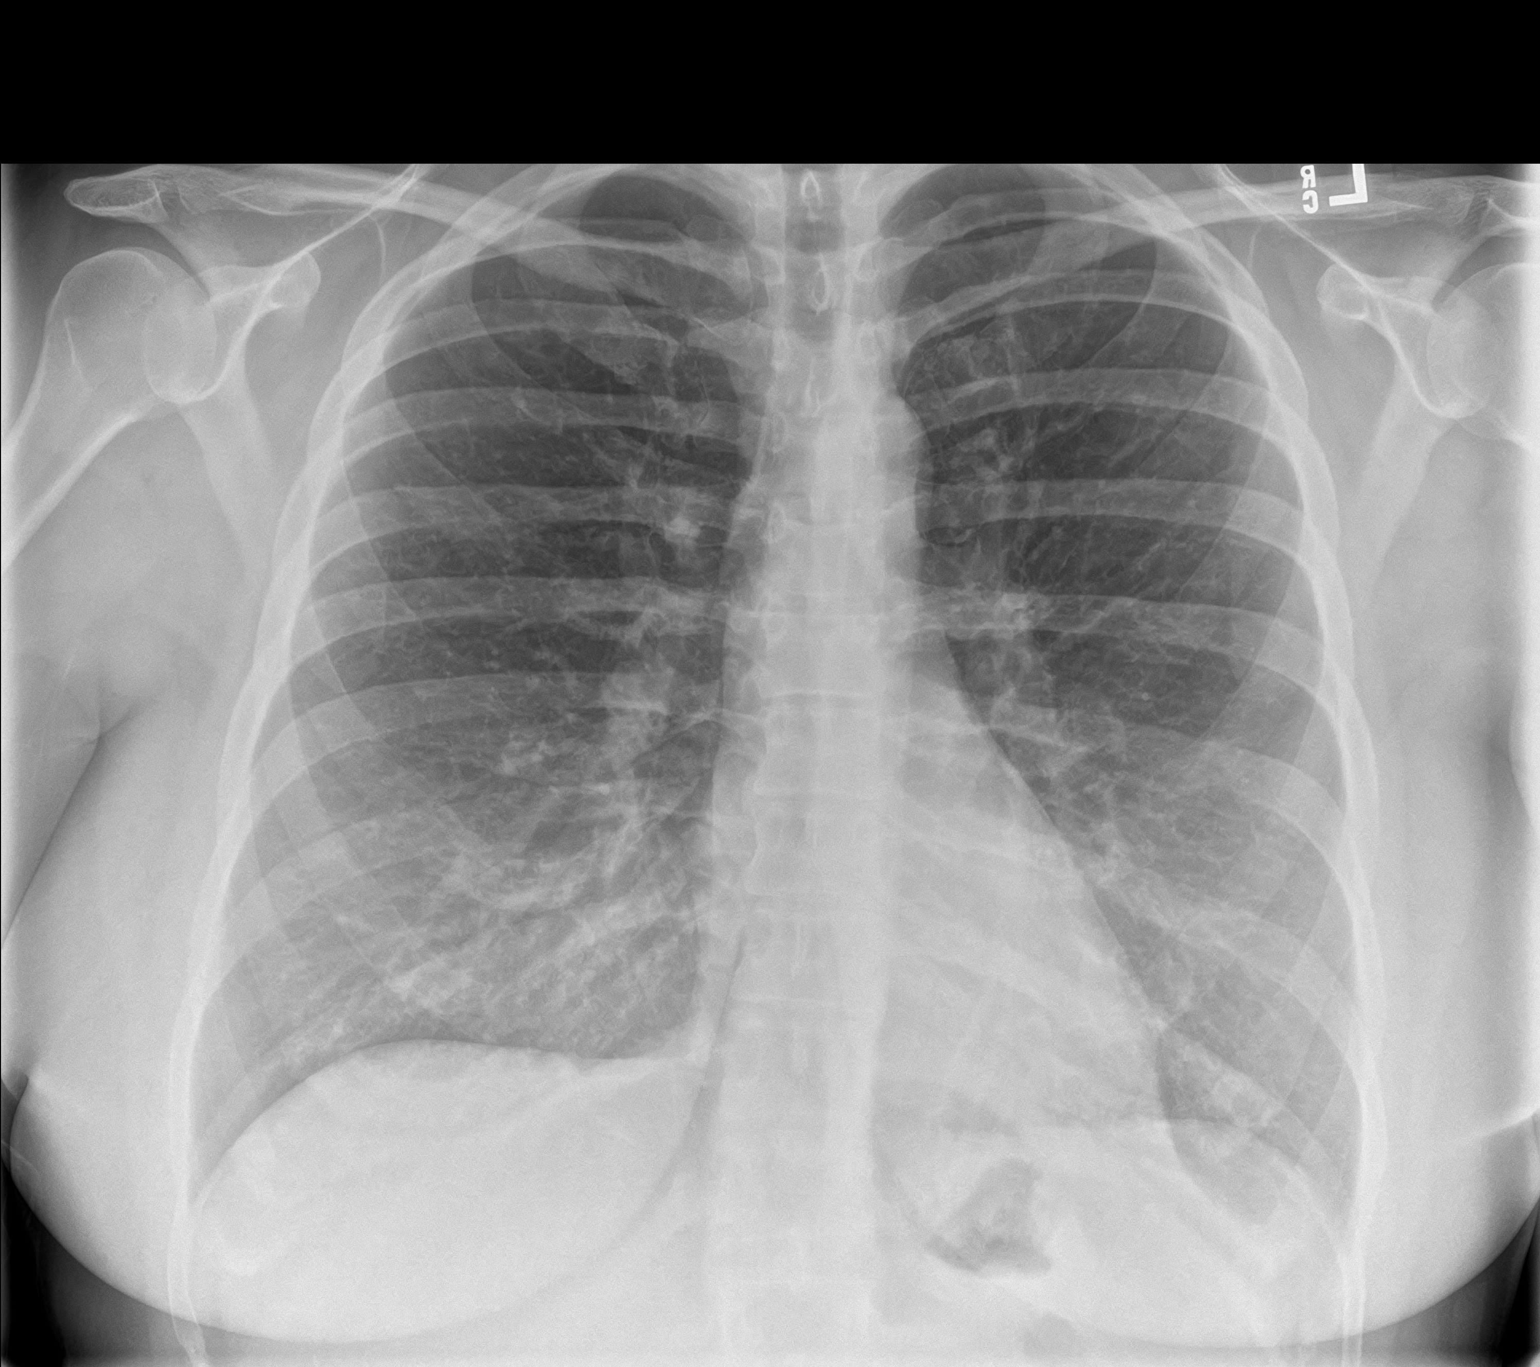
[im 2/2]
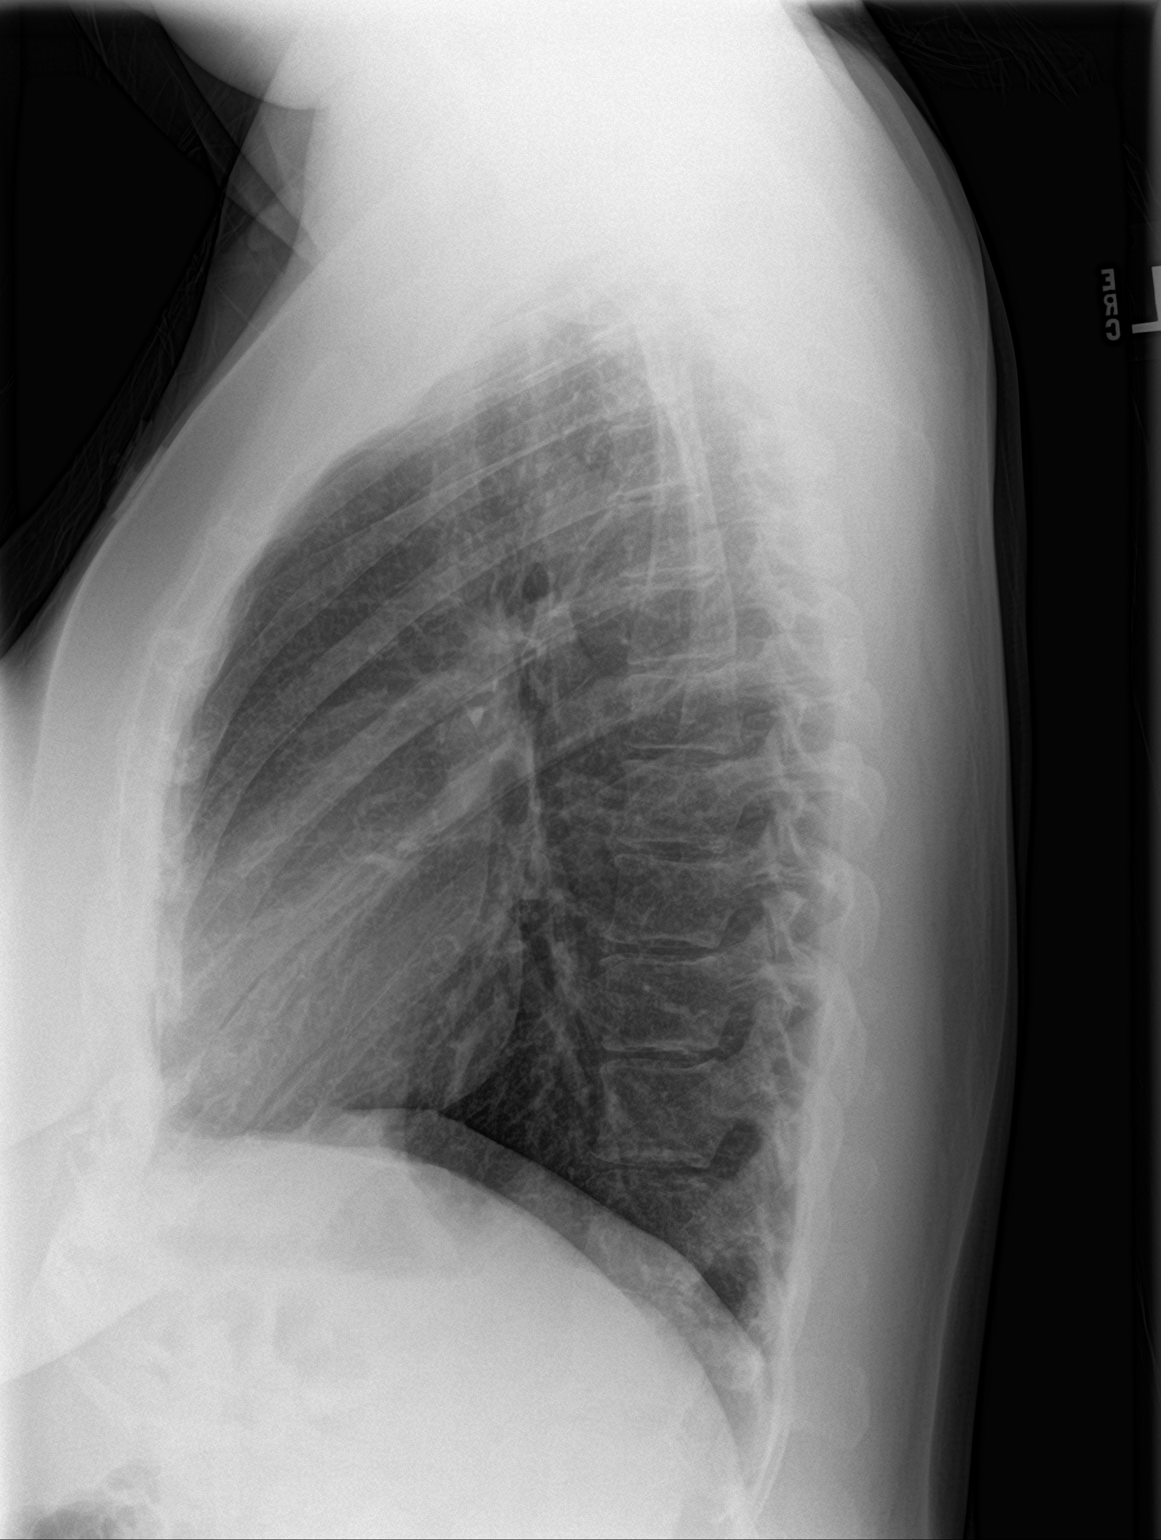

[2 of 2 positions shown; findings below may reference images not displayed]

FINDINGS: The cardiomediastinal contours are normal. Central bronchial
thickening. Mild patchy right infrahilar opacity. Pulmonary
vasculature is normal. No consolidation, pleural effusion, or
pneumothorax. No acute osseous abnormalities are seen.
IMPRESSION: Central bronchial thickening can be seen with asthma, bronchitis, or
smoking. Patchy opacity in the right infrahilar lung favors
atelectasis.

## 2021-07-19 ENCOUNTER — Ambulatory Visit (LOCAL_COMMUNITY_HEALTH_CENTER): Payer: Self-pay | Admitting: Advanced Practice Midwife

## 2021-07-19 ENCOUNTER — Other Ambulatory Visit: Payer: Self-pay

## 2021-07-19 ENCOUNTER — Encounter: Payer: Self-pay | Admitting: Advanced Practice Midwife

## 2021-07-19 VITALS — BP 132/84 | Ht 65.0 in | Wt 177.0 lb

## 2021-07-19 DIAGNOSIS — Z3009 Encounter for other general counseling and advice on contraception: Secondary | ICD-10-CM

## 2021-07-19 DIAGNOSIS — F1410 Cocaine abuse, uncomplicated: Secondary | ICD-10-CM | POA: Insufficient documentation

## 2021-07-19 DIAGNOSIS — F17200 Nicotine dependence, unspecified, uncomplicated: Secondary | ICD-10-CM

## 2021-07-19 DIAGNOSIS — T7411XS Adult physical abuse, confirmed, sequela: Secondary | ICD-10-CM

## 2021-07-19 DIAGNOSIS — R030 Elevated blood-pressure reading, without diagnosis of hypertension: Secondary | ICD-10-CM

## 2021-07-19 DIAGNOSIS — T7411XA Adult physical abuse, confirmed, initial encounter: Secondary | ICD-10-CM | POA: Insufficient documentation

## 2021-07-19 DIAGNOSIS — F1290 Cannabis use, unspecified, uncomplicated: Secondary | ICD-10-CM | POA: Insufficient documentation

## 2021-07-19 DIAGNOSIS — E663 Overweight: Secondary | ICD-10-CM

## 2021-07-19 DIAGNOSIS — F410 Panic disorder [episodic paroxysmal anxiety] without agoraphobia: Secondary | ICD-10-CM | POA: Insufficient documentation

## 2021-07-19 DIAGNOSIS — Z30431 Encounter for routine checking of intrauterine contraceptive device: Secondary | ICD-10-CM
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
## 2021-07-19 NOTE — Progress Notes (Signed)
Pt here for PE.  Pt also needs to have IUD removed. Referral to [facility] for IUD removal.  Windle Guard, RN ? ? ?

## 2021-07-20 LAB — WET PREP FOR TRICH, YEAST, CLUE
Clue Cell Exam: NEGATIVE
Trichomonas Exam: POSITIVE — AB
Yeast Exam: NEGATIVE

## 2021-07-28 ENCOUNTER — Telehealth: Payer: Self-pay

## 2021-07-28 NOTE — Telephone Encounter (Signed)
Patient needs treatment for Trich. ?Spoke with Vida Roller, nurse with [facility].   ?Copy of labs faxed. ?[facility] detention [facility] will treat patient.  ?

## 2021-07-28 NOTE — Telephone Encounter (Addendum)
Spoke with Simonne Come, LPN, at [facility], where pt is currently detained; informed that pt needs tx for chlamydia.  Test result can be found on state labs website.  Provider is still there and LPN will forward information to provider for review. 07/19/21 vaginal specimen positive for chlamydia.  ?[facility] requested that TR be faxed to them for their records. They are going to tx pt. ?

## 2021-08-03 NOTE — Telephone Encounter (Signed)
Brenton Grills, RN faxed copy of TR to [facility] on 08/02/21. ?

## 2022-05-24 ENCOUNTER — Emergency Department
Admission: EM | Admit: 2022-05-24 | Discharge: 2022-05-24 | Disposition: A | Discharge status: Home / Self Care | Payer: Self-pay | Attending: Emergency Medicine | Admitting: Emergency Medicine

## 2022-05-24 ENCOUNTER — Other Ambulatory Visit: Payer: Self-pay

## 2022-05-24 DIAGNOSIS — Z20822 Contact with and (suspected) exposure to covid-19: Secondary | ICD-10-CM | POA: Insufficient documentation

## 2022-05-24 DIAGNOSIS — J020 Streptococcal pharyngitis: Secondary | ICD-10-CM | POA: Insufficient documentation

## 2022-05-24 LAB — POC URINE PREG, ED: Preg Test, Ur: NEGATIVE

## 2022-05-24 LAB — RESP PANEL BY RT-PCR (RSV, FLU A&B, COVID)  RVPGX2
Influenza A by PCR: NEGATIVE
Influenza B by PCR: NEGATIVE
Resp Syncytial Virus by PCR: NEGATIVE
SARS Coronavirus 2 by RT PCR: NEGATIVE

## 2022-05-24 LAB — CHLAMYDIA/NGC RT PCR (ARMC ONLY)
Chlamydia Tr: NOT DETECTED
N gonorrhoeae: NOT DETECTED

## 2022-05-24 LAB — GROUP A STREP BY PCR: Group A Strep by PCR: DETECTED — AB

## 2022-05-24 MED ORDER — ACETAMINOPHEN 500 MG PO TABS
1000.0000 mg | ORAL_TABLET | Freq: Once | ORAL | Status: AC
  Administered 2022-05-24: 1000 mg via ORAL
  Filled 2022-05-24: qty 2

## 2022-05-24 MED ORDER — IBUPROFEN 600 MG PO TABS: 600.0000 mg | ORAL_TABLET | Freq: Four times a day (QID) | ORAL | 0 refills | Status: AC | PRN

## 2022-05-24 MED ORDER — DEXAMETHASONE 1 MG/ML PO CONC
10.0000 mg | Freq: Once | ORAL | Status: DC
  Filled 2022-05-24: qty 10

## 2022-05-24 MED ORDER — DEXAMETHASONE 10 MG/ML FOR PEDIATRIC ORAL USE: 10.0000 mg | Freq: Once | INTRAMUSCULAR | Status: DC

## 2022-05-24 MED ORDER — KETOROLAC TROMETHAMINE 15 MG/ML IJ SOLN
15.0000 mg | Freq: Once | INTRAMUSCULAR | Status: DC
  Filled 2022-05-24: qty 1

## 2022-05-24 MED ORDER — KETOROLAC TROMETHAMINE 15 MG/ML IJ SOLN
15.0000 mg | Freq: Once | INTRAMUSCULAR | Status: AC
  Administered 2022-05-24: 15 mg via INTRAVENOUS

## 2022-05-24 MED ORDER — SODIUM CHLORIDE 0.9 % IV SOLN
3.0000 g | Freq: Once | INTRAVENOUS | Status: AC
  Administered 2022-05-24: 3 g via INTRAVENOUS
  Filled 2022-05-24: qty 8

## 2022-05-24 MED ORDER — AMOXICILLIN-POT CLAVULANATE 875-125 MG PO TABS: 1.0000 | ORAL_TABLET | Freq: Two times a day (BID) | ORAL | 0 refills | Status: AC

## 2022-05-24 MED ORDER — DEXAMETHASONE SODIUM PHOSPHATE 10 MG/ML IJ SOLN
10.0000 mg | Freq: Once | INTRAMUSCULAR | Status: AC
  Administered 2022-05-24: 10 mg via INTRAVENOUS
  Filled 2022-05-24: qty 1

## 2022-05-24 NOTE — ED Notes (Signed)
Pt to ED for sore throat since 2 days. States tonsils are swollen. Voice is clear. Unlabored respirations. Ambulatory to treatment room.

## 2022-05-24 NOTE — Discharge Instructions (Addendum)
Take Tylenol 1 g every 8 hours with the ibuprofen.  Use the antibiotics for your strep positive test and return to the ER if you develop worsening symptoms or any other concerns   Return For shortness of breath Worsening throat or neck pain Enlarging mass Bleeding Neck stiffness

## 2022-05-24 NOTE — ED Triage Notes (Signed)
Arrives C/O sore throat x 3 days.  Also nasal congestion and fever.  States tonsils are swollen.  Goodys powder taken just PTA  AAOx3.  Skin warm and dry. Voice clear and strong  Managing secretions.
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
## 2023-11-19 ENCOUNTER — Encounter: Payer: Self-pay | Admitting: Emergency Medicine

## 2023-11-19 ENCOUNTER — Other Ambulatory Visit: Payer: Self-pay

## 2023-11-19 ENCOUNTER — Emergency Department: Payer: MEDICAID

## 2023-11-19 ENCOUNTER — Emergency Department
Admission: EM | Admit: 2023-11-19 | Discharge: 2023-11-19 | Disposition: A | Discharge status: Home / Self Care | Payer: MEDICAID | Attending: Emergency Medicine | Admitting: Emergency Medicine

## 2023-11-19 DIAGNOSIS — R0789 Other chest pain: Secondary | ICD-10-CM | POA: Diagnosis present

## 2023-11-19 LAB — CBC
HCT: 40.1 % (ref 36.0–46.0)
Hemoglobin: 12.7 g/dL (ref 12.0–15.0)
MCH: 29.1 pg (ref 26.0–34.0)
MCHC: 31.7 g/dL (ref 30.0–36.0)
MCV: 91.8 fL (ref 80.0–100.0)
Platelets: 346 K/uL (ref 150–400)
RBC: 4.37 MIL/uL (ref 3.87–5.11)
RDW: 12.1 % (ref 11.5–15.5)
WBC: 7.2 K/uL (ref 4.0–10.5)
nRBC: 0 % (ref 0.0–0.2)

## 2023-11-19 LAB — TROPONIN I (HIGH SENSITIVITY): Troponin I (High Sensitivity): <2 ng/L (ref <18)

## 2023-11-19 LAB — BASIC METABOLIC PANEL WITH GFR
Anion gap: 11 (ref 5–15)
BUN: 8 mg/dL (ref 6–20)
CO2: 24 mmol/L (ref 22–32)
Calcium: 8.7 mg/dL — ABNORMAL LOW (ref 8.9–10.3)
Chloride: 102 mmol/L (ref 98–111)
Creatinine, Ser: 0.64 mg/dL (ref 0.44–1.00)
GFR, Estimated: >60 mL/min (ref >60)
Glucose, Bld: 115 mg/dL — ABNORMAL HIGH (ref 70–99)
Potassium: 3.4 mmol/L — ABNORMAL LOW (ref 3.5–5.1)
Sodium: 137 mmol/L (ref 135–145)

## 2023-11-19 NOTE — ED Provider Notes (Signed)
 [facility] Provider Note    Event Date/Time   First MD Initiated Contact with Patient 11/19/23 1547     (approximate)   History   Chest Pain   HPI  Whitney Rojas is a 33 y.o. female who presents with complaints of chest discomfort, now resolved.  Patient reports chest discomfort lasted about 20 minutes and was a mild pressure sensation.  No history of heart disease.  No substance abuse today.  No nausea vomiting or diaphoresis.  She thinks it may be related to anxiety.  Now she feels well     Physical Exam   Triage Vital Signs: ED Triage Vitals  Encounter Vitals Group     BP 11/19/23 1428 (!) 138/91     Girls Systolic BP Percentile --      Girls Diastolic BP Percentile --      Boys Systolic BP Percentile --      Boys Diastolic BP Percentile --      Pulse Rate 11/19/23 1428 (!) 103     Resp 11/19/23 1428 17     Temp 11/19/23 1428 97.6 F (36.4 C)     Temp Source 11/19/23 1428 Oral     SpO2 11/19/23 1428 100 %     Weight 11/19/23 1427 77.1 kg (170 lb)     Height 11/19/23 1427 1.651 m (5' 5)     Head Circumference --      Peak Flow --      Pain Score 11/19/23 1426 6     Pain Loc --      Pain Education --      Exclude from Growth Chart --     Most recent vital signs: Vitals:   11/19/23 1428  BP: (!) 138/91  Pulse: (!) 103  Resp: 17  Temp: 97.6 F (36.4 C)  SpO2: 100%     General: Awake, no distress.  CV:  Good peripheral perfusion.  Regular rate and rhythm, no chest wall tenderness palpation Resp:  Normal effort.  Clear to auscultation bilaterally Abd:  No distention.  Other:  No calf pain or swelling   ED Results / Procedures / Treatments   Labs (all labs ordered are listed, but only abnormal results are displayed) Labs Reviewed  BASIC METABOLIC PANEL WITH GFR - Abnormal; Notable for the following components:      Result Value   Potassium 3.4 (*)    Glucose, Bld 115 (*)    Calcium 8.7 (*)    All other components within  normal limits  CBC  TROPONIN I (HIGH SENSITIVITY)     EKG  ED ECG REPORT I, Lamar Price, the attending physician, personally viewed and interpreted this ECG.  Date: 11/19/2023  Rhythm: normal sinus rhythm QRS Axis: normal Intervals: normal ST/T Wave abnormalities: normal Narrative Interpretation: no evidence of acute ischemia    RADIOLOGY Chest x-ray viewed interpret by me, no acute abnormality    PROCEDURES:  Critical Care performed:   Procedures   MEDICATIONS ORDERED IN ED: Medications - No data to display   IMPRESSION / MDM / ASSESSMENT AND PLAN / ED COURSE  I reviewed the triage vital signs and the nursing notes. Patient's presentation is most consistent with acute presentation with potential threat to life or bodily function.  Patient presents with chest pain as detailed above, she is low risk for ACS given her age.  Vital signs overall reassuring, she was tachycardic upon arrival but this improved on my exam, heart rate was  normal.  EKG is reassuring, high sensitive troponin is undetectable.  Lab work otherwise unremarkable.  Chest x-ray without acute abnormality.  Patient remains asymptomatic here and quite well-appearing, no indication for further workup or admission at this time, close outpatient follow-up with PCP, return precautions discussed        FINAL CLINICAL IMPRESSION(S) / ED DIAGNOSES   Final diagnoses:  Atypical chest pain     Rx / DC Orders   ED Discharge Orders     None        Note:  This document was prepared using Dragon voice recognition software and may include unintentional dictation errors.   Arlander [place], MD 11/19/23 [phone]

## 2023-11-19 NOTE — ED Triage Notes (Signed)
 Patient to ED via POV for centralized CP. Started 20 minutes PTA. Denies cardiac hx. Pt tearful. Denies drug or alcohol use.

## 2024-03-07 ENCOUNTER — Emergency Department
Admission: EM | Admit: 2024-03-07 | Discharge: 2024-03-07 | Disposition: A | Discharge status: Home / Self Care | Payer: MEDICAID | Attending: Emergency Medicine | Admitting: Emergency Medicine

## 2024-03-07 ENCOUNTER — Other Ambulatory Visit: Payer: Self-pay

## 2024-03-07 ENCOUNTER — Emergency Department: Payer: MEDICAID

## 2024-03-07 DIAGNOSIS — J069 Acute upper respiratory infection, unspecified: Secondary | ICD-10-CM | POA: Insufficient documentation

## 2024-03-07 DIAGNOSIS — R6884 Jaw pain: Secondary | ICD-10-CM | POA: Diagnosis not present

## 2024-03-07 DIAGNOSIS — R059 Cough, unspecified: Secondary | ICD-10-CM | POA: Diagnosis present

## 2024-03-07 LAB — RESP PANEL BY RT-PCR (RSV, FLU A&B, COVID)  RVPGX2
Influenza A by PCR: NEGATIVE
Influenza B by PCR: NEGATIVE
Resp Syncytial Virus by PCR: NEGATIVE
SARS Coronavirus 2 by RT PCR: NEGATIVE

## 2024-03-07 MED ORDER — OXYCODONE-ACETAMINOPHEN 5-325 MG PO TABS: 1.0000 | ORAL_TABLET | ORAL | 0 refills | Status: AC | PRN

## 2024-03-07 MED ORDER — OXYCODONE-ACETAMINOPHEN 5-325 MG PO TABS
1.0000 | ORAL_TABLET | Freq: Once | ORAL | Status: AC
  Administered 2024-03-07: 1 via ORAL
  Filled 2024-03-07: qty 1

## 2024-03-07 MED ORDER — AMOXICILLIN-POT CLAVULANATE 875-125 MG PO TABS: 1.0000 | ORAL_TABLET | Freq: Two times a day (BID) | ORAL | 0 refills | Status: AC
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
## 2024-03-07 NOTE — ED Triage Notes (Addendum)
 Pt comes with fractured jaw since July. Pt went to [facility] month ago and was given referral to dentist. Pt has not had CT scan done. Pt did have xray done at dentist office. . Pt states pain there on left sided.
